# Patient Record
Sex: Female | Born: 1995 | Race: Black or African American | Hispanic: No | Marital: Single | Smoking: Former smoker
Health system: Southern US, Community
[De-identification: ages and names within clinical notes are randomized; demographics above are authoritative.]

## PROBLEM LIST (undated history)

## (undated) ENCOUNTER — Inpatient Hospital Stay (HOSPITAL_COMMUNITY): Payer: Self-pay

## (undated) DIAGNOSIS — F329 Major depressive disorder, single episode, unspecified: Secondary | ICD-10-CM

## (undated) DIAGNOSIS — F32A Depression, unspecified: Secondary | ICD-10-CM

## (undated) DIAGNOSIS — F419 Anxiety disorder, unspecified: Secondary | ICD-10-CM

## (undated) HISTORY — PX: NO PAST SURGERIES: SHX2092

---
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
## 2017-01-04 ENCOUNTER — Ambulatory Visit (INDEPENDENT_AMBULATORY_CARE_PROVIDER_SITE_OTHER): Payer: Medicaid Other | Admitting: Obstetrics & Gynecology

## 2017-01-04 DIAGNOSIS — R110 Nausea: Secondary | ICD-10-CM

## 2017-01-04 DIAGNOSIS — O219 Vomiting of pregnancy, unspecified: Secondary | ICD-10-CM

## 2017-01-04 DIAGNOSIS — Z3400 Encounter for supervision of normal first pregnancy, unspecified trimester: Secondary | ICD-10-CM

## 2017-01-04 DIAGNOSIS — K219 Gastro-esophageal reflux disease without esophagitis: Secondary | ICD-10-CM | POA: Insufficient documentation

## 2017-01-04 DIAGNOSIS — Z3403 Encounter for supervision of normal first pregnancy, third trimester: Secondary | ICD-10-CM

## 2017-01-04 MED ORDER — PANTOPRAZOLE SODIUM 40 MG PO TBEC: 40.0000 mg | DELAYED_RELEASE_TABLET | Freq: Every day | ORAL | 1 refills | Status: DC

## 2017-01-04 MED ORDER — METOCLOPRAMIDE HCL 5 MG PO TABS: 5.0000 mg | ORAL_TABLET | Freq: Three times a day (TID) | ORAL | 1 refills | Status: DC

## 2017-01-04 MED ORDER — ONDANSETRON 8 MG PO TBDP: ORAL_TABLET | ORAL | 0 refills | Status: DC

## 2017-01-04 NOTE — Progress Notes (Signed)
   PRENATAL VISIT NOTE  Subjective:nausea and reflux  Barbara Mack is a 21 y.o. G2P0010 at 1784w0d being seen today for ongoing prenatal care.  She is currently monitored for the following issues for this low-risk pregnancy and has Supervision of normal first pregnancy, antepartum; Gonorrhea affecting pregnancy, antepartum; Insufficient prenatal care; and History of hypertension in pediatric patient on her problem list.  Patient reports heartburn, nausea, vomiting and smoking marijuana to improve appetite.  Contractions: Irregular. Vag. Bleeding: None.  Movement: Present. Denies leaking of fluid.   The following portions of the patient's history were reviewed and updated as appropriate: allergies, current medications, past family history, past medical history, past social history, past surgical history and problem list. Problem list updated.  Objective:   Vitals:   01/04/17 1526  BP: 117/79  Pulse: 75  Weight: 144 lb (65.3 kg)    Fetal Status: Fetal Heart Rate (bpm): 143   Movement: Present     General:  Alert, oriented and cooperative. Patient is in no acute distress.  Skin: Skin is warm and dry. No rash noted.   Cardiovascular: Normal heart rate noted  Respiratory: Normal respiratory effort, no problems with respiration noted  Abdomen: Soft, gravid, appropriate for gestational age. Pain/Pressure: Present     Pelvic:  Cervical exam deferred        Extremities: Normal range of motion.  Edema: None  Mental Status: Normal mood and affect. Normal behavior. Normal judgment and thought content.   Assessment and Plan:  Pregnancy: G2P0010 at 184w0d  1. Supervision of normal first pregnancy, antepartum GERD  - pantoprazole (PROTONIX) 40 MG tablet; Take 1 tablet (40 mg total) by mouth daily.  Dispense: 30 tablet; Refill: 1 - metoCLOPramide (REGLAN) 5 MG tablet; Take 1 tablet (5 mg total) by mouth 4 (four) times daily -  before meals and at bedtime.  Dispense: 60 tablet; Refill: 1  2.  Nausea  - ondansetron (ZOFRAN-ODT) 8 MG disintegrating tablet; DISSOLVE ONE TABLET BY MOUTH EVERY 8 HOURS AS NEEDED FOR NAUSEA AND VOMITING  Dispense: 12 tablet; Refill: 0  Preterm labor symptoms and general obstetric precautions including but not limited to vaginal bleeding, contractions, leaking of fluid and fetal movement were reviewed in detail with the patient. Please refer to After Visit Summary for other counseling recommendations.  Return in about 2 weeks (around 01/18/2017).   Adam PhenixJames G Arnold, MD

## 2017-01-18 ENCOUNTER — Ambulatory Visit (INDEPENDENT_AMBULATORY_CARE_PROVIDER_SITE_OTHER): Payer: Medicaid Other | Admitting: Obstetrics & Gynecology

## 2017-01-18 VITALS — BP 121/71 | HR 97 | Temp 98.0°F | Wt 149.6 lb

## 2017-01-18 DIAGNOSIS — K219 Gastro-esophageal reflux disease without esophagitis: Secondary | ICD-10-CM

## 2017-01-18 DIAGNOSIS — Z3403 Encounter for supervision of normal first pregnancy, third trimester: Secondary | ICD-10-CM

## 2017-01-18 DIAGNOSIS — Z3400 Encounter for supervision of normal first pregnancy, unspecified trimester: Secondary | ICD-10-CM

## 2017-01-18 MED ORDER — PANTOPRAZOLE SODIUM 40 MG PO TBEC: 80.0000 mg | DELAYED_RELEASE_TABLET | Freq: Every day | ORAL | 1 refills | Status: DC

## 2017-01-18 NOTE — Patient Instructions (Signed)
Heartburn During Pregnancy Heartburn is a type of pain or discomfort that can happen in the throat or chest. It is often described as a burning sensation. Heartburn is common during pregnancy because:  A hormone (progesterone) that is released during pregnancy may relax the valve (lower esophageal sphincter, or LES) that separates the esophagus from the stomach. This allows stomach acid to move up into the esophagus, causing heartburn.  The uterus gets larger and pushes up on the stomach, which pushes more acid into the esophagus. This is especially true in the later stages of pregnancy. Heartburn usually goes away or gets better after giving birth. What are the causes? Heartburn is caused by stomach acid backing up into the esophagus (reflux). Reflux can be triggered by:  Changing hormone levels.  Large meals.  Certain foods and beverages, such as coffee, chocolate, onions, and peppermint.  Exercise.  Increased stomach acid production. What increases the risk? You are more likely to experience heartburn during pregnancy if you:  Had heartburn prior to becoming pregnant.  Have been pregnant more than once before.  Are overweight or obese. The likelihood that you will get heartburn also increases as you get farther along in your pregnancy, especially during the last trimester. What are the signs or symptoms? Symptoms of this condition include:  Burning pain in the chest or lower throat.  Bitter taste in the mouth.  Coughing.  Problems swallowing.  Vomiting.  Hoarse voice.  Asthma. Symptoms may get worse when you lie down or bend over. Symptoms are often worse at night. How is this diagnosed? This condition is diagnosed based on:  Your medical history.  Your symptoms.  Blood tests to check for a certain type of bacteria associated with heartburn.  Whether taking heartburn medicine relieves your symptoms.  Examination of the stomach and esophagus using a tube with  a light and camera on the end (endoscopy). How is this treated? Treatment varies depending on how severe your symptoms are. Your health care provider may recommend:  Over-the-counter medicines (antacids or acid reducers) for mild heartburn.  Prescription medicines to decrease stomach acid or to protect your stomach lining.  Certain changes in your diet.  Raising the head of your bed so it is higher than the foot of the bed. This helps prevent stomach acid from backing up into the esophagus when you are lying down. Follow these instructions at home: Eating and drinking  Do not drink alcohol during your pregnancy.  Identify foods and beverages that make your symptoms worse, and avoid them.  Beverages that you may want to avoid include:  Coffee and tea (with or without caffeine).  Energy drinks and sports drinks.  Carbonated drinks or sodas.  Citrus fruit juices.  Foods that you may want to avoid include:  Chocolate and cocoa.  Peppermint and mint flavorings.  Garlic, onions, and horseradish.  Spicy and acidic foods, including peppers, chili powder, curry powder, vinegar, hot sauces, and barbecue sauce.  Citrus fruits, such as oranges, lemons, and limes.  Tomato-based foods, such as red sauce, chili, and salsa.  Fried and fatty foods, such as donuts, french fries, potato chips, and high-fat dressings.  High-fat meats, such as hot dogs, cold cuts, sausage, ham, and bacon.  High-fat dairy items, such as whole milk, butter, and cheese.  Eat small, frequent meals instead of large meals.  Avoid drinking large amounts of liquid with your meals.  Avoid eating meals during the 2-3 hours before bedtime.  Avoid lying down right   after you eat.  Do not exercise right after you eat. Medicines  Take over-the-counter and prescription medicines only as told by your health care provider.  Do not take aspirin, ibuprofen, or other NSAIDs unless your health care provider tells  you to do that.  You may be instructed to avoid medicines that contain sodium bicarbonate. General instructions  If directed, raise the head of your bed about 6 inches (15 cm) by putting blocks under the legs. Sleeping with more pillows does not effectively relieve heartburn because it only changes the position of your head.  Do not use any products that contain nicotine or tobacco, such as cigarettes and e-cigarettes. If you need help quitting, ask your health care provider.  Wear loose-fitting clothing.  Try to reduce your stress, such as with yoga or meditation. If you need help managing stress, ask your health care provider.  Maintain a healthy weight. If you are overweight, work with your health care provider to safely lose weight.  Keep all follow-up visits as told by your health care provider. This is important. Contact a health care provider if:  You develop new symptoms.  Your symptoms do not improve with treatment.  You have unexplained weight loss.  You have difficulty swallowing.  You make loud sounds when you breathe (wheeze).  You have a cough that does not go away.  You have frequent heartburn for more than 2 weeks.  You have nausea or vomiting that does not get better with treatment.  You have pain in your abdomen. Get help right away if:  You have severe chest pain that spreads to your arm, neck, or jaw.  You feel sweaty, dizzy, or light-headed.  You have shortness of breath.  You have pain when swallowing.  You vomit, and your vomit looks like blood or coffee grounds.  Your stool is bloody or black. This information is not intended to replace advice given to you by your health care provider. Make sure you discuss any questions you have with your health care provider. Document Released: 11/25/2000 Document Revised: 08/15/2016 Document Reviewed: 08/15/2016 Elsevier Interactive Patient Education  2017 [employer] Inc[employer]  

## 2017-01-18 NOTE — Progress Notes (Signed)
   PRENATAL VISIT NOTE  Subjective:  Barbara Mack is a 21 y.o. G2P0010 at 4348w0d being seen today for ongoing prenatal care.  She is currently monitored for the following issues for this low-risk pregnancy and has Supervision of normal first pregnancy, antepartum; Gonorrhea affecting pregnancy, antepartum; Insufficient prenatal care; History of hypertension in pediatric patient; and GERD (gastroesophageal reflux disease) on her problem list.  Patient reports heartburn and nausea.  Contractions: Not present. Vag. Bleeding: None.  Movement: Present. Denies leaking of fluid.   The following portions of the patient's history were reviewed and updated as appropriate: allergies, current medications, past family history, past medical history, past social history, past surgical history and problem list. Problem list updated.  Objective:   Vitals:   01/18/17 1508  BP: 121/71  Pulse: 97  Temp: 98 F (36.7 C)  Weight: 149 lb 9.6 oz (67.9 kg)    Fetal Status: Fetal Heart Rate (bpm): 132 Fundal Height: 33 cm Movement: Present     General:  Alert, oriented and cooperative. Patient is in no acute distress.  Skin: Skin is warm and dry. No rash noted.   Cardiovascular: Normal heart rate noted  Respiratory: Normal respiratory effort, no problems with respiration noted  Abdomen: Soft, gravid, appropriate for gestational age. Pain/Pressure: Absent     Pelvic:  Cervical exam deferred        Extremities: Normal range of motion.  Edema: None  Mental Status: Normal mood and affect. Normal behavior. Normal judgment and thought content.   Assessment and Plan:  Pregnancy: G2P0010 at 6748w0d  1. Supervision of normal first pregnancy, antepartum Still has reflux taking Protonix and Reglan - pantoprazole (PROTONIX) 40 MG tablet; Take 2 tablets (80 mg total) by mouth daily.  Dispense: 60 tablet; Refill: 1  2. Gastroesophageal reflux disease, esophagitis presence not specified   Preterm labor symptoms and  general obstetric precautions including but not limited to vaginal bleeding, contractions, leaking of fluid and fetal movement were reviewed in detail with the patient. Please refer to After Visit Summary for other counseling recommendations.  Return in about 2 weeks (around 02/01/2017).   Adam PhenixJames G Arnold, MD

## 2017-01-18 NOTE — Progress Notes (Signed)
Patient reports that she had intense cramping a few nights ago without bleeding, but none since. Patient reports good fetal movement.

## 2017-02-02 ENCOUNTER — Other Ambulatory Visit (HOSPITAL_COMMUNITY)
Admission: RE | Admit: 2017-02-02 | Discharge: 2017-02-02 | Disposition: A | Discharge status: Home / Self Care | Payer: Medicaid Other | Source: Ambulatory Visit | Attending: Certified Nurse Midwife | Admitting: Certified Nurse Midwife

## 2017-02-02 ENCOUNTER — Ambulatory Visit (INDEPENDENT_AMBULATORY_CARE_PROVIDER_SITE_OTHER): Payer: Medicaid Other | Admitting: Certified Nurse Midwife

## 2017-02-02 VITALS — BP 106/67 | HR 82 | Wt 151.0 lb

## 2017-02-02 DIAGNOSIS — Z3A35 35 weeks gestation of pregnancy: Secondary | ICD-10-CM | POA: Insufficient documentation

## 2017-02-02 DIAGNOSIS — O0933 Supervision of pregnancy with insufficient antenatal care, third trimester: Secondary | ICD-10-CM

## 2017-02-02 DIAGNOSIS — Z3483 Encounter for supervision of other normal pregnancy, third trimester: Secondary | ICD-10-CM | POA: Insufficient documentation

## 2017-02-02 DIAGNOSIS — Z3400 Encounter for supervision of normal first pregnancy, unspecified trimester: Secondary | ICD-10-CM

## 2017-02-02 DIAGNOSIS — K219 Gastro-esophageal reflux disease without esophagitis: Secondary | ICD-10-CM

## 2017-02-02 NOTE — Progress Notes (Signed)
   PRENATAL VISIT NOTE  Subjective:  Barbara Mack is a 21 y.o. G2P0010 at 277w1d being seen today for ongoing prenatal care.  She is currently monitored for the following issues for this low-risk pregnancy and has Supervision of normal first pregnancy, antepartum; Gonorrhea affecting pregnancy, antepartum; Insufficient prenatal care; History of hypertension in pediatric patient; and GERD (gastroesophageal reflux disease) on her problem list.  Patient reports no complaints.  Contractions: Not present. Vag. Bleeding: None.  Movement: Present. Denies leaking of fluid.   The following portions of the patient's history were reviewed and updated as appropriate: allergies, current medications, past family history, past medical history, past social history, past surgical history and problem list. Problem list updated.  Objective:   Vitals:   02/02/17 1524  BP: 106/67  Pulse: 82  Weight: 151 lb (68.5 kg)    Fetal Status: Fetal Heart Rate (bpm): 140 Fundal Height: 35 cm Movement: Present  Presentation: Vertex  General:  Alert, oriented and cooperative. Patient is in no acute distress.  Skin: Skin is warm and dry. No rash noted.   Cardiovascular: Normal heart rate noted  Respiratory: Normal respiratory effort, no problems with respiration noted  Abdomen: Soft, gravid, appropriate for gestational age. Pain/Pressure: Present     Pelvic:  Cervical exam performed Dilation: Closed Effacement (%): 0 Station: Ballotable  Extremities: Normal range of motion.     Mental Status: Normal mood and affect. Normal behavior. Normal judgment and thought content.   Assessment and Plan:  Pregnancy: G2P0010 at 4677w1d  1. Gastroesophageal reflux disease, esophagitis presence not specified      Taking TUMS  2. Supervision of normal first pregnancy, antepartum      Doing well - Strep Gp B NAA - Cervicovaginal ancillary only  3. Insufficient prenatal care in third trimester       Preterm labor symptoms and  general obstetric precautions including but not limited to vaginal bleeding, contractions, leaking of fluid and fetal movement were reviewed in detail with the patient. Please refer to After Visit Summary for other counseling recommendations.  Return in about 1 week (around 02/09/2017) for ROB.   Roe Coombsachelle A Keerthana Vanrossum, CNM

## 2017-02-02 NOTE — Progress Notes (Signed)
Pt states lower pelvic pressure/pain.

## 2017-02-03 LAB — OB RESULTS CONSOLE GC/CHLAMYDIA: Gonorrhea: NEGATIVE

## 2017-02-03 LAB — CERVICOVAGINAL ANCILLARY ONLY
BACTERIAL VAGINITIS: NEGATIVE
CANDIDA VAGINITIS: POSITIVE — AB
Chlamydia: NEGATIVE
Neisseria Gonorrhea: NEGATIVE
Trichomonas: NEGATIVE

## 2017-02-04 LAB — STREP GP B NAA: STREP GROUP B AG: POSITIVE — AB

## 2017-02-06 ENCOUNTER — Other Ambulatory Visit: Payer: Self-pay | Admitting: Certified Nurse Midwife

## 2017-02-06 DIAGNOSIS — B3731 Acute candidiasis of vulva and vagina: Secondary | ICD-10-CM

## 2017-02-06 DIAGNOSIS — B373 Candidiasis of vulva and vagina: Secondary | ICD-10-CM

## 2017-02-06 DIAGNOSIS — Z3400 Encounter for supervision of normal first pregnancy, unspecified trimester: Secondary | ICD-10-CM

## 2017-02-06 MED ORDER — FLUCONAZOLE 150 MG PO TABS: 150.0000 mg | ORAL_TABLET | Freq: Once | ORAL | 0 refills | Status: AC

## 2017-02-09 ENCOUNTER — Encounter: Payer: Medicaid Other | Admitting: Certified Nurse Midwife

## 2017-02-13 ENCOUNTER — Ambulatory Visit (INDEPENDENT_AMBULATORY_CARE_PROVIDER_SITE_OTHER): Payer: Medicaid Other | Admitting: Certified Nurse Midwife

## 2017-02-13 VITALS — BP 108/67 | HR 97 | Wt 154.0 lb

## 2017-02-13 DIAGNOSIS — K219 Gastro-esophageal reflux disease without esophagitis: Secondary | ICD-10-CM

## 2017-02-13 DIAGNOSIS — O99613 Diseases of the digestive system complicating pregnancy, third trimester: Secondary | ICD-10-CM

## 2017-02-13 DIAGNOSIS — Z3483 Encounter for supervision of other normal pregnancy, third trimester: Secondary | ICD-10-CM

## 2017-02-13 DIAGNOSIS — Z3400 Encounter for supervision of normal first pregnancy, unspecified trimester: Secondary | ICD-10-CM

## 2017-02-13 NOTE — Progress Notes (Signed)
Pt states increase in cramping.

## 2017-02-13 NOTE — Progress Notes (Signed)
   PRENATAL VISIT NOTE  Subjective:  Barbara Mack is a 21 y.o. G2P0010 at 4574w5d being seen today for ongoing prenatal care.  She is currently monitored for the following issues for this low-risk pregnancy and has Supervision of normal first pregnancy, antepartum; Gonorrhea affecting pregnancy, antepartum; Insufficient prenatal care; and GERD (gastroesophageal reflux disease) on her problem list.  Patient reports no bleeding, no leaking and occasional contractions.  Contractions: Irregular. Vag. Bleeding: None.  Movement: Present. Denies leaking of fluid.   The following portions of the patient's history were reviewed and updated as appropriate: allergies, current medications, past family history, past medical history, past social history, past surgical history and problem list. Problem list updated.  Objective:   Vitals:   02/13/17 1558  BP: 108/67  Pulse: 97  Weight: 154 lb (69.9 kg)    Fetal Status: Fetal Heart Rate (bpm): 138 Fundal Height: 36 cm Movement: Present  Presentation: Vertex  General:  Alert, oriented and cooperative. Patient is in no acute distress.  Skin: Skin is warm and dry. No rash noted.   Cardiovascular: Normal heart rate noted  Respiratory: Normal respiratory effort, no problems with respiration noted  Abdomen: Soft, gravid, appropriate for gestational age. Pain/Pressure: Present     Pelvic:  Cervical exam performed Dilation: 1 Effacement (%): 50 Station: -3  Extremities: Normal range of motion.     Mental Status: Normal mood and affect. Normal behavior. Normal judgment and thought content.   Assessment and Plan:  Pregnancy: G2P0010 at 2274w5d  1. Gastroesophageal reflux disease, esophagitis presence not specified     Taking Protonix, Reglan and OTC Tums.   2. Supervision of normal first pregnancy, antepartum      Doing well.  Labor s/s reviewed.   Preterm labor symptoms and general obstetric precautions including but not limited to vaginal bleeding,  contractions, leaking of fluid and fetal movement were reviewed in detail with the patient. Please refer to After Visit Summary for other counseling recommendations.  Return in about 1 week (around 02/20/2017) for ROB.   Roe Coombsachelle A Avrey Flanagin, CNM

## 2017-02-15 ENCOUNTER — Inpatient Hospital Stay (EMERGENCY_DEPARTMENT_HOSPITAL)
Admission: AD | Admit: 2017-02-15 | Discharge: 2017-02-15 | Disposition: A | Discharge status: Home / Self Care | Payer: Medicaid Other | Source: Ambulatory Visit | Attending: Obstetrics & Gynecology | Admitting: Obstetrics & Gynecology

## 2017-02-15 ENCOUNTER — Encounter (HOSPITAL_COMMUNITY): Payer: Self-pay | Admitting: *Deleted

## 2017-02-15 ENCOUNTER — Inpatient Hospital Stay (HOSPITAL_COMMUNITY): Payer: Medicaid Other

## 2017-02-15 DIAGNOSIS — Z3A37 37 weeks gestation of pregnancy: Secondary | ICD-10-CM

## 2017-02-15 DIAGNOSIS — O26893 Other specified pregnancy related conditions, third trimester: Secondary | ICD-10-CM | POA: Insufficient documentation

## 2017-02-15 DIAGNOSIS — O479 False labor, unspecified: Secondary | ICD-10-CM

## 2017-02-15 DIAGNOSIS — O288 Other abnormal findings on antenatal screening of mother: Secondary | ICD-10-CM

## 2017-02-15 DIAGNOSIS — O9989 Other specified diseases and conditions complicating pregnancy, childbirth and the puerperium: Secondary | ICD-10-CM | POA: Diagnosis not present

## 2017-02-15 DIAGNOSIS — R030 Elevated blood-pressure reading, without diagnosis of hypertension: Secondary | ICD-10-CM

## 2017-02-15 LAB — CBC
HEMATOCRIT: 31.8 % — AB (ref 36.0–46.0)
Hemoglobin: 10.6 g/dL — ABNORMAL LOW (ref 12.0–15.0)
MCH: 29.1 pg (ref 26.0–34.0)
MCHC: 33.3 g/dL (ref 30.0–36.0)
MCV: 87.4 fL (ref 78.0–100.0)
Platelets: 205 10*3/uL (ref 150–400)
RBC: 3.64 MIL/uL — AB (ref 3.87–5.11)
RDW: 13.7 % (ref 11.5–15.5)
WBC: 9.9 10*3/uL (ref 4.0–10.5)

## 2017-02-15 LAB — COMPREHENSIVE METABOLIC PANEL
ALT: 17 U/L (ref 14–54)
AST: 20 U/L (ref 15–41)
Albumin: 3.1 g/dL — ABNORMAL LOW (ref 3.5–5.0)
Alkaline Phosphatase: 115 U/L (ref 38–126)
Anion gap: 8 (ref 5–15)
BILIRUBIN TOTAL: 0.3 mg/dL (ref 0.3–1.2)
CO2: 21 mmol/L — ABNORMAL LOW (ref 22–32)
CREATININE: 0.55 mg/dL (ref 0.44–1.00)
Calcium: 8.4 mg/dL — ABNORMAL LOW (ref 8.9–10.3)
Chloride: 105 mmol/L (ref 101–111)
GFR calc Af Amer: >60 mL/min (ref >60)
Glucose, Bld: 78 mg/dL (ref 65–99)
Potassium: 3.3 mmol/L — ABNORMAL LOW (ref 3.5–5.1)
Sodium: 134 mmol/L — ABNORMAL LOW (ref 135–145)
TOTAL PROTEIN: 6.8 g/dL (ref 6.5–8.1)

## 2017-02-15 LAB — PROTEIN / CREATININE RATIO, URINE
CREATININE, URINE: 179 mg/dL
Protein Creatinine Ratio: 0.2 mg/mg{Cre} — ABNORMAL HIGH (ref 0.00–0.15)
Total Protein, Urine: 36 mg/dL

## 2017-02-15 MED ORDER — FENTANYL CITRATE (PF) 100 MCG/2ML IJ SOLN
50.0000 ug | Freq: Once | INTRAMUSCULAR | Status: AC
  Administered 2017-02-15: 50 ug via INTRAMUSCULAR
  Filled 2017-02-15 (×2): qty 2

## 2017-02-15 NOTE — Discharge Instructions (Signed)
Hypertension During Pregnancy Hypertension is also called high blood pressure. High blood pressure means that the force of your blood moving in your body is too strong. When you are pregnant, this condition should be watched carefully. It can cause problems for you and your baby. Follow these instructions at home: Eating and drinking  Drink enough fluid to keep your pee (urine) clear or pale yellow.  Eat healthy foods that are low in salt (sodium). ? Do not add salt to your food. ? Check labels on foods and drinks to see much salt is in them. Look on the label where you see "Sodium." Lifestyle  Do not use any products that contain nicotine or tobacco, such as cigarettes and e-cigarettes. If you need help quitting, ask your doctor.  Do not use alcohol.  Avoid caffeine.  Avoid stress. Rest and get plenty of sleep. General instructions  Take over-the-counter and prescription medicines only as told by your doctor.  While lying down, lie on your left side. This keeps pressure off your baby.  While sitting or lying down, raise (elevate) your feet. Try putting some pillows under your lower legs.  Exercise regularly. Ask your doctor what kinds of exercise are best for you.  Keep all prenatal and follow-up visits as told by your doctor. This is important. Contact a doctor if:  You have symptoms that your doctor told you to watch for, such as: ? Fever. ? Throwing up (vomiting). ? Headache. Get help right away if:  You have very bad pain in your belly (abdomen).  You are throwing up, and this does not get better with treatment.  You suddenly get swelling in your hands, ankles, or face.  You gain 4 lb (1.8 kg) or more in 1 week.  You get bleeding from your vagina.  You have blood in your pee.  You do not feel your baby moving as much as normal.  You have a change in vision.  You have muscle twitching or sudden tightening (spasms).  You have trouble breathing.  Your lips  or fingernails turn blue. This information is not intended to replace advice given to you by your health care provider. Make sure you discuss any questions you have with your health care provider. Document Released: 12/31/2010 Document Revised: 08/09/2016 Document Reviewed: 08/09/2016 Elsevier Interactive Patient Education  2017 Elsevier Inc.  

## 2017-02-15 NOTE — MAU Provider Note (Signed)
History     CSN: [id number]  Arrival date and time: 02/15/17 1301   None     Chief Complaint  Patient presents with  . Contractions   HPI   Ms.Barbara Mack is a 21 y.o. female G2P0010 @ 80w0dhere in MAU with complaints of contractions.  Patient reports good fetal  Movement. She denies vaginal bleeding or leaking of fluid. Patient declines history of hypertension. Says she never had HTN, says she never took medication for HTN. Confirmed this with her grandmother who says "just a strong family history of hypertension" .   OB History    Gravida Para Term Preterm AB Living   2       1     SAB TAB Ectopic Multiple Live Births   1              Past Medical History:  Diagnosis Date  . Anxiety   . Depression    currenly not on meds, doing better    Past Surgical History:  Procedure Laterality Date  . NO PAST SURGERIES      Family History  Problem Relation Age of Onset  . Diabetes Mother   . Hypertension Mother   . Fibromyalgia Mother   . Diabetes Maternal Grandmother   . Hypertension Maternal Grandmother   . Stroke Maternal Grandmother   . Kidney disease Paternal Grandmother     Social History  Substance Use Topics  . Smoking status: Former Smoker    Packs/day: 0.25    Years: 6.00    Types: Cigarettes  . Smokeless tobacco: Never Used  . Alcohol use No    Allergies:  Allergies  Allergen Reactions  . Lactose Intolerance (Gi)     Prescriptions Prior to Admission  Medication Sig Dispense Refill Last Dose  . calcium carbonate (TUMS - DOSED IN MG ELEMENTAL CALCIUM) 500 MG chewable tablet Chew 1 tablet by mouth daily.   Taking  . metoCLOPramide (REGLAN) 5 MG tablet Take 1 tablet (5 mg total) by mouth 4 (four) times daily -  before meals and at bedtime. 60 tablet 1 Taking  . ondansetron (ZOFRAN-ODT) 8 MG disintegrating tablet DISSOLVE ONE TABLET BY MOUTH EVERY 8 HOURS AS NEEDED FOR NAUSEA AND VOMITING 12 tablet 0 Taking  . pantoprazole (PROTONIX) 40 MG tablet  Take 2 tablets (80 mg total) by mouth daily. 60 tablet 1   . Prenatal Vit-Fe Fumarate-FA (MULTIVITAMIN-PRENATAL) 27-0.8 MG TABS tablet Take 1 tablet by mouth daily at 12 noon.   Taking   Results for orders placed or performed during the hospital encounter of 02/15/17 (from the past 48 hour(s))  CBC     Status: Abnormal   Collection Time: 02/15/17  3:08 PM  Result Value Ref Range   WBC 9.9 4.0 - 10.5 K/uL   RBC 3.64 (L) 3.87 - 5.11 MIL/uL   Hemoglobin 10.6 (L) 12.0 - 15.0 g/dL   HCT 31.8 (L) 36.0 - 46.0 %   MCV 87.4 78.0 - 100.0 fL   MCH 29.1 26.0 - 34.0 pg   MCHC 33.3 30.0 - 36.0 g/dL   RDW 13.7 11.5 - 15.5 %   Platelets 205 150 - 400 K/uL  Comprehensive metabolic panel     Status: Abnormal   Collection Time: 02/15/17  3:08 PM  Result Value Ref Range   Sodium 134 (L) 135 - 145 mmol/L   Potassium 3.3 (L) 3.5 - 5.1 mmol/L   Chloride 105 101 - 111 mmol/L   CO2 21 (  L) 22 - 32 mmol/L   Glucose, Bld 78 65 - 99 mg/dL   BUN <5 (L) 6 - 20 mg/dL    Comment: REPEATED TO VERIFY   Creatinine, Ser 0.55 0.44 - 1.00 mg/dL   Calcium 8.4 (L) 8.9 - 10.3 mg/dL   Total Protein 6.8 6.5 - 8.1 g/dL   Albumin 3.1 (L) 3.5 - 5.0 g/dL   AST 20 15 - 41 U/L   ALT 17 14 - 54 U/L   Alkaline Phosphatase 115 38 - 126 U/L   Total Bilirubin 0.3 0.3 - 1.2 mg/dL   GFR calc non Af Amer >60 >60 mL/min   GFR calc Af Amer >60 >60 mL/min    Comment: (NOTE) The eGFR has been calculated using the CKD EPI equation. This calculation has not been validated in all clinical situations. eGFR's persistently <60 mL/min signify possible Chronic Kidney Disease.    Anion gap 8 5 - 15  Protein / creatinine ratio, urine     Status: Abnormal   Collection Time: 02/15/17  4:20 PM  Result Value Ref Range   Creatinine, Urine 179.00 mg/dL   Total Protein, Urine 36 mg/dL    Comment: NO NORMAL RANGE ESTABLISHED FOR THIS TEST   Protein Creatinine Ratio 0.20 (H) 0.00 - 0.15 mg/mg[Cre]    Review of Systems  Constitutional: Negative  for fever.  Eyes: Negative for photophobia.  Gastrointestinal: Positive for abdominal pain (Contraction pain, no upper abdominal pain. ).  Neurological: Negative for headaches.   Physical Exam   Blood pressure 135/92, pulse 86, temperature 97.7 F (36.5 C), temperature source Oral, resp. rate 18, last menstrual period 06/01/2016, SpO2 100 %.   Patient Vitals for the past 24 hrs:  BP Temp Temp src Pulse Resp SpO2  02/15/17 1922 135/92 - - 86 - -  02/15/17 1800 123/66 - - 65 - -  02/15/17 1757 120/73 - - 77 18 -  02/15/17 1715 122/60 - - 78 - -  02/15/17 1700 120/60 - - 70 - -  02/15/17 1646 119/71 - - 82 - -  02/15/17 1615 138/93 - - 84 - -  02/15/17 1600 126/83 - - 81 - -  02/15/17 1530 132/85 - - 89 - -  02/15/17 1515 142/93 - - 81 - -  02/15/17 1500 139/87 - - 73 - -  02/15/17 1453 133/84 - - 85 - -  02/15/17 1451 128/90 - - 85 - -  02/15/17 1313 136/78 97.7 F (36.5 C) Oral 100 17 100 %    Physical Exam  Constitutional: She is oriented to person, place, and time. She appears well-developed and well-nourished. She appears distressed (Patient uncomfortable with contractions ).  HENT:  Head: Normocephalic.  Respiratory: Effort normal.  GI: Soft. She exhibits no distension. There is no tenderness. There is no rebound.  Genitourinary:  Genitourinary Comments: On arrival patient was 2 cm/ 90%  Dilation: 3 Effacement (%): 90 Cervical Position: Anterior Station: -2 Presentation: Vertex Exam by:: Weston,RN  Musculoskeletal: Normal range of motion.  Neurological: She is alert and oriented to person, place, and time.  Skin: Skin is warm.  Psychiatric: Her behavior is normal.   Fetal Tracing: Baseline: 135 bpm  Variability: Moderate  Accelerations: 10x10 and 15x15 Decelerations: frequent variables  Toco: Irregular pattern Q3-5  MAU Course  Procedures  None  MDM   Reviewed fetal tracing: BPP ordered BPP 8/8> reactive NST Discussed previous BP readings from  record with Dr. Ihor Dow.  BP elevated 128/90, 142/93,  [facility] labs ordered Discussed patient with Dr. Ihor Dow Fentanyl given 50 mcg IM, will evaluate and recheck her cervix.  Cervix unchanged from previous exam.  BP down  1451: first elevated BP 128/90; Discharge BP @ 1922 135/92 Discussed with Dr. Ihor Dow, ok to DC home.   Patient has had no cervical change in 2 hours. Patient's pain is down to 4/10 following fentanyl.   Assessment and Plan   A:   1. Elevated BP without diagnosis of hypertension   2. Non-reactive NST (non-stress test)   3. Irregular uterine contractions     P:  Discharge home in stable condition Strict return precautions.  Preeclampsia precautions  Go to the [facility] @ 0800 for BP check Return to MAU if symptoms worsen  Kick counts   Lezlie Lye, NP 02/15/2017 8:16 PM

## 2017-02-15 NOTE — MAU Note (Signed)
Saw Dr on Monday, was checked 1/50, started contracting and bleeding after exam. Yesterday the contractions continued and she lost her mucous plug. Contractions are getting worser; last 1 min with 4-985min breaks between them.

## 2017-02-15 NOTE — MAU Note (Signed)
Pt reports contractions since Monday.

## 2017-02-16 ENCOUNTER — Inpatient Hospital Stay (HOSPITAL_COMMUNITY): Payer: Medicaid Other | Admitting: Anesthesiology

## 2017-02-16 ENCOUNTER — Encounter (HOSPITAL_COMMUNITY): Payer: Self-pay

## 2017-02-16 ENCOUNTER — Inpatient Hospital Stay (HOSPITAL_COMMUNITY)
Admission: AD | Admit: 2017-02-16 | Discharge: 2017-02-18 | DRG: 775 | Disposition: A | Discharge status: Home / Self Care | Payer: Medicaid Other | Source: Ambulatory Visit | Attending: Obstetrics & Gynecology | Admitting: Obstetrics & Gynecology

## 2017-02-16 DIAGNOSIS — Z8249 Family history of ischemic heart disease and other diseases of the circulatory system: Secondary | ICD-10-CM

## 2017-02-16 DIAGNOSIS — O99824 Streptococcus B carrier state complicating childbirth: Secondary | ICD-10-CM | POA: Diagnosis present

## 2017-02-16 DIAGNOSIS — Z3A37 37 weeks gestation of pregnancy: Secondary | ICD-10-CM

## 2017-02-16 DIAGNOSIS — Z823 Family history of stroke: Secondary | ICD-10-CM | POA: Diagnosis not present

## 2017-02-16 DIAGNOSIS — Z833 Family history of diabetes mellitus: Secondary | ICD-10-CM

## 2017-02-16 DIAGNOSIS — Z87891 Personal history of nicotine dependence: Secondary | ICD-10-CM

## 2017-02-16 DIAGNOSIS — Z3493 Encounter for supervision of normal pregnancy, unspecified, third trimester: Secondary | ICD-10-CM | POA: Diagnosis present

## 2017-02-16 LAB — TYPE AND SCREEN
ABO/RH(D): A POS
Antibody Screen: NEGATIVE

## 2017-02-16 LAB — CBC
HCT: 33.4 % — ABNORMAL LOW (ref 36.0–46.0)
Hemoglobin: 11.3 g/dL — ABNORMAL LOW (ref 12.0–15.0)
MCH: 29.1 pg (ref 26.0–34.0)
MCHC: 33.8 g/dL (ref 30.0–36.0)
MCV: 86.1 fL (ref 78.0–100.0)
PLATELETS: 226 10*3/uL (ref 150–400)
RBC: 3.88 MIL/uL (ref 3.87–5.11)
RDW: 13.7 % (ref 11.5–15.5)
WBC: 11.2 10*3/uL — ABNORMAL HIGH (ref 4.0–10.5)

## 2017-02-16 LAB — ABO/RH: ABO/RH(D): A POS

## 2017-02-16 LAB — POCT FERN TEST: POCT Fern Test: POSITIVE

## 2017-02-16 MED ORDER — FLEET ENEMA 7-19 GM/118ML RE ENEM: 1.0000 | ENEMA | RECTAL | Status: DC | PRN

## 2017-02-16 MED ORDER — OXYCODONE HCL 5 MG PO TABS
5.0000 mg | ORAL_TABLET | ORAL | Status: DC | PRN
  Administered 2017-02-16: 5 mg via ORAL
  Filled 2017-02-16: qty 1

## 2017-02-16 MED ORDER — DIBUCAINE 1 % RE OINT: 1.0000 "application " | TOPICAL_OINTMENT | RECTAL | Status: DC | PRN

## 2017-02-16 MED ORDER — ONDANSETRON HCL 4 MG/2ML IJ SOLN
4.0000 mg | Freq: Four times a day (QID) | INTRAMUSCULAR | Status: DC | PRN
  Administered 2017-02-16: 4 mg via INTRAVENOUS
  Filled 2017-02-16: qty 2

## 2017-02-16 MED ORDER — OXYTOCIN 40 UNITS IN LACTATED RINGERS INFUSION - SIMPLE MED
2.5000 [IU]/h | INTRAVENOUS | Status: DC
  Filled 2017-02-16: qty 1000

## 2017-02-16 MED ORDER — ACETAMINOPHEN 325 MG PO TABS: 650.0000 mg | ORAL_TABLET | ORAL | Status: DC | PRN

## 2017-02-16 MED ORDER — PENICILLIN G POT IN DEXTROSE 60000 UNIT/ML IV SOLN
3.0000 10*6.[IU] | INTRAVENOUS | Status: DC
  Filled 2017-02-16 (×3): qty 50

## 2017-02-16 MED ORDER — EPHEDRINE 5 MG/ML INJ
10.0000 mg | INTRAVENOUS | Status: DC | PRN
Start: 2017-02-16 — End: 2017-02-16
  Filled 2017-02-16: qty 4

## 2017-02-16 MED ORDER — ZOLPIDEM TARTRATE 5 MG PO TABS: 5.0000 mg | ORAL_TABLET | Freq: Every evening | ORAL | Status: DC | PRN

## 2017-02-16 MED ORDER — FENTANYL CITRATE (PF) 100 MCG/2ML IJ SOLN: 50.0000 ug | INTRAMUSCULAR | Status: DC | PRN

## 2017-02-16 MED ORDER — EPHEDRINE 5 MG/ML INJ
10.0000 mg | INTRAVENOUS | Status: DC | PRN
  Filled 2017-02-16: qty 4

## 2017-02-16 MED ORDER — SIMETHICONE 80 MG PO CHEW: 80.0000 mg | CHEWABLE_TABLET | ORAL | Status: DC | PRN

## 2017-02-16 MED ORDER — OXYCODONE-ACETAMINOPHEN 5-325 MG PO TABS: 1.0000 | ORAL_TABLET | ORAL | Status: DC | PRN

## 2017-02-16 MED ORDER — LACTATED RINGERS IV SOLN
INTRAVENOUS | Status: DC
  Administered 2017-02-16: 03:00:00 via INTRAVENOUS

## 2017-02-16 MED ORDER — TETANUS-DIPHTH-ACELL PERTUSSIS 5-2.5-18.5 LF-MCG/0.5 IM SUSP: 0.5000 mL | Freq: Once | INTRAMUSCULAR | Status: DC

## 2017-02-16 MED ORDER — LACTATED RINGERS IV SOLN
500.0000 mL | INTRAVENOUS | Status: DC | PRN
  Administered 2017-02-16: 250 mL via INTRAVENOUS

## 2017-02-16 MED ORDER — SOD CITRATE-CITRIC ACID 500-334 MG/5ML PO SOLN
30.0000 mL | ORAL | Status: DC | PRN
  Filled 2017-02-16: qty 15

## 2017-02-16 MED ORDER — LIDOCAINE HCL (PF) 1 % IJ SOLN
INTRAMUSCULAR | Status: DC | PRN
  Administered 2017-02-16 (×2): 5 mL

## 2017-02-16 MED ORDER — FENTANYL 2.5 MCG/ML BUPIVACAINE 1/10 % EPIDURAL INFUSION (WH - ANES)
14.0000 mL/h | INTRAMUSCULAR | Status: DC | PRN
  Administered 2017-02-16: 14 mL/h via EPIDURAL
  Filled 2017-02-16 (×3): qty 100

## 2017-02-16 MED ORDER — OXYCODONE-ACETAMINOPHEN 5-325 MG PO TABS: 2.0000 | ORAL_TABLET | ORAL | Status: DC | PRN

## 2017-02-16 MED ORDER — TERBUTALINE SULFATE 1 MG/ML IJ SOLN
0.2500 mg | Freq: Once | INTRAMUSCULAR | Status: DC | PRN
  Filled 2017-02-16: qty 1

## 2017-02-16 MED ORDER — DIPHENHYDRAMINE HCL 50 MG/ML IJ SOLN: 12.5000 mg | INTRAMUSCULAR | Status: DC | PRN

## 2017-02-16 MED ORDER — LACTATED RINGERS IV SOLN: 500.0000 mL | Freq: Once | INTRAVENOUS | Status: DC

## 2017-02-16 MED ORDER — BENZOCAINE-MENTHOL 20-0.5 % EX AERO
1.0000 "application " | INHALATION_SPRAY | CUTANEOUS | Status: DC | PRN
  Administered 2017-02-16: 1 via TOPICAL
  Filled 2017-02-16: qty 56

## 2017-02-16 MED ORDER — DIPHENHYDRAMINE HCL 25 MG PO CAPS: 25.0000 mg | ORAL_CAPSULE | Freq: Four times a day (QID) | ORAL | Status: DC | PRN

## 2017-02-16 MED ORDER — OXYTOCIN BOLUS FROM INFUSION
500.0000 mL | Freq: Once | INTRAVENOUS | Status: AC
  Administered 2017-02-16: 500 mL via INTRAVENOUS

## 2017-02-16 MED ORDER — COCONUT OIL OIL: 1.0000 "application " | TOPICAL_OIL | Status: DC | PRN

## 2017-02-16 MED ORDER — PHENYLEPHRINE 40 MCG/ML (10ML) SYRINGE FOR IV PUSH (FOR BLOOD PRESSURE SUPPORT)
80.0000 ug | PREFILLED_SYRINGE | INTRAVENOUS | Status: DC | PRN
  Filled 2017-02-16: qty 5

## 2017-02-16 MED ORDER — PHENYLEPHRINE 40 MCG/ML (10ML) SYRINGE FOR IV PUSH (FOR BLOOD PRESSURE SUPPORT)
80.0000 ug | PREFILLED_SYRINGE | INTRAVENOUS | Status: DC | PRN
  Filled 2017-02-16: qty 5
  Filled 2017-02-16 (×2): qty 10

## 2017-02-16 MED ORDER — SENNOSIDES-DOCUSATE SODIUM 8.6-50 MG PO TABS
2.0000 | ORAL_TABLET | ORAL | Status: DC
  Administered 2017-02-16 – 2017-02-17 (×2): 2 via ORAL
  Filled 2017-02-16 (×2): qty 2

## 2017-02-16 MED ORDER — WITCH HAZEL-GLYCERIN EX PADS: 1.0000 "application " | MEDICATED_PAD | CUTANEOUS | Status: DC | PRN

## 2017-02-16 MED ORDER — IBUPROFEN 600 MG PO TABS
600.0000 mg | ORAL_TABLET | Freq: Four times a day (QID) | ORAL | Status: DC
  Administered 2017-02-16 – 2017-02-18 (×9): 600 mg via ORAL
  Filled 2017-02-16 (×9): qty 1

## 2017-02-16 MED ORDER — ONDANSETRON HCL 4 MG PO TABS: 4.0000 mg | ORAL_TABLET | ORAL | Status: DC | PRN

## 2017-02-16 MED ORDER — PENICILLIN G POTASSIUM 5000000 UNITS IJ SOLR
5.0000 10*6.[IU] | Freq: Once | INTRAVENOUS | Status: AC
  Administered 2017-02-16: 5 10*6.[IU] via INTRAVENOUS
  Filled 2017-02-16: qty 5

## 2017-02-16 MED ORDER — ACETAMINOPHEN 325 MG PO TABS
650.0000 mg | ORAL_TABLET | ORAL | Status: DC | PRN
  Administered 2017-02-17: 650 mg via ORAL
  Filled 2017-02-16: qty 2

## 2017-02-16 MED ORDER — LIDOCAINE HCL (PF) 1 % IJ SOLN
30.0000 mL | INTRAMUSCULAR | Status: DC | PRN
  Administered 2017-02-16: 30 mL via SUBCUTANEOUS
  Filled 2017-02-16: qty 30

## 2017-02-16 MED ORDER — ONDANSETRON HCL 4 MG/2ML IJ SOLN: 4.0000 mg | INTRAMUSCULAR | Status: DC | PRN

## 2017-02-16 MED ORDER — PRENATAL MULTIVITAMIN CH
1.0000 | ORAL_TABLET | Freq: Every day | ORAL | Status: DC
  Administered 2017-02-16 – 2017-02-18 (×3): 1 via ORAL
  Filled 2017-02-16 (×3): qty 1

## 2017-02-16 MED ORDER — FENTANYL CITRATE (PF) 100 MCG/2ML IJ SOLN
100.0000 ug | INTRAMUSCULAR | Status: DC | PRN
  Administered 2017-02-16: 100 ug via INTRAVENOUS
  Filled 2017-02-16: qty 2

## 2017-02-16 NOTE — H&P (Signed)
Barbara Mack is a 21 y.o. female G1 @ 37.1wks presenting for reg ctx today. Having some sm bloody show; denies leaking. Her preg has been followed by the [facility] office and has been remarkable for 1) onset of care @ 22.5wks 2) GBS pos 3) +GC in 10/17 (neg TOC)  OB History    Gravida Para Term Preterm AB Living   2       1     SAB TAB Ectopic Multiple Live Births   1             Past Medical History:  Diagnosis Date  . Anxiety   . Depression    currenly not on meds, doing better   Past Surgical History:  Procedure Laterality Date  . NO PAST SURGERIES     Family History: family history includes Diabetes in her maternal grandmother and mother; Fibromyalgia in her mother; Hypertension in her maternal grandmother and mother; Kidney disease in her paternal grandmother; Stroke in her maternal grandmother. Social History:  reports that she has quit smoking. Her smoking use included Cigarettes. She has a 1.50 pack-year smoking history. She has never used smokeless tobacco. She reports that she uses drugs, including Marijuana, about 7 times per week. She reports that she does not drink alcohol.     Maternal Diabetes: No Genetic Screening: Normal Maternal Ultrasounds/Referrals: Normal Fetal Ultrasounds or other Referrals:  None Maternal Substance Abuse:  No Significant Maternal Medications:  None Significant Maternal Lab Results:  Lab values include: Group B Strep positive Other Comments:  None  ROS History Dilation: 3 Effacement (%): 100 Exam by:: Kayren EavesAshley Garvey RN  Blood pressure 131/66, pulse 61, temperature 97.8 F (36.6 C), temperature source Oral, resp. rate 20, height 5\' 4"  (1.626 m), weight 68 kg (150 lb), last menstrual period 06/01/2016. Exam Physical Exam  Constitutional: She is oriented to person, place, and time. She appears well-developed.  HENT:  Head: Normocephalic.  Neck: Normal range of motion.  Cardiovascular: Normal rate.   Respiratory: Effort normal.  GI:   EFM 130s, +accels, occ mi variables Ctx irreg, q 3-8 mins  Musculoskeletal: Normal range of motion.  Neurological: She is alert and oriented to person, place, and time.  Skin: Skin is warm and dry.  Psychiatric: She has a normal mood and affect. Her behavior is normal. Thought content normal.    Prenatal labs: ABO, Rh: A/Positive/-- (11/27 1435) Antibody: Negative (11/27 1435) Rubella: 2.44 (11/27 1435) RPR: Non Reactive (12/11 1130)  HBsAg: Negative (11/27 1435)  HIV: Non Reactive (12/11 1130)  GBS: Positive (02/22 1628)   Assessment/Plan: IUP@37 .1wks Latent labor GBS pos  [facility] for GBS ppx Expectant management Plan Fentanyl for pain initially, and then if labor is progressing will get epidural Anticipate SVD   SHAW, KIMBERLY CNM 02/16/2017, 3:14 AM

## 2017-02-16 NOTE — Anesthesia Preprocedure Evaluation (Signed)

## 2017-02-16 NOTE — Anesthesia Procedure Notes (Signed)
Epidural Patient location during procedure: OB  Staffing Anesthesiologist: Fontaine Kossman Performed: anesthesiologist   Preanesthetic Checklist Completed: patient identified, site marked, surgical consent, pre-op evaluation, timeout performed, IV checked, risks and benefits discussed and monitors and equipment checked  Epidural Patient position: sitting Prep: DuraPrep Patient monitoring: heart rate, continuous pulse ox and blood pressure Approach: right paramedian Location: L3-L4 Injection technique: LOR saline  Needle:  Needle type: Tuohy  Needle gauge: 17 G Needle length: 9 cm and 9 Needle insertion depth: 4 cm Catheter type: closed end flexible Catheter size: 20 Guage Catheter at skin depth: 8 cm Test dose: negative  Assessment Events: blood not aspirated, injection not painful, no injection resistance, negative IV test and no paresthesia  Additional Notes Patient identified. Risks/Benefits/Options discussed with patient including but not limited to bleeding, infection, nerve damage, paralysis, failed block, incomplete pain control, headache, blood pressure changes, nausea, vomiting, reactions to medication both or allergic, itching and postpartum back pain. Confirmed with bedside nurse the patient's most recent platelet count. Confirmed with patient that they are not currently taking any anticoagulation, have any bleeding history or any family history of bleeding disorders. Patient expressed understanding and wished to proceed. All questions were answered. Sterile technique was used throughout the entire procedure. Please see nursing notes for vital signs. Test dose was given through epidural needle and negative prior to continuing to dose epidural or start infusion. Warning signs of high block given to the patient including shortness of breath, tingling/numbness in hands, complete motor block, or any concerning symptoms with instructions to call for help. Patient was given  instructions on fall risk and not to get out of bed. All questions and concerns addressed with instructions to call with any issues.     

## 2017-02-16 NOTE — Anesthesia Postprocedure Evaluation (Signed)
Anesthesia Post Note  Patient: Barbara Mack  Procedure(s) Performed: * No procedures listed *  Patient location during evaluation: Mother Baby Anesthesia Type: Epidural Level of consciousness: awake Pain management: pain level controlled Vital Signs Assessment: post-procedure vital signs reviewed and stable Respiratory status: spontaneous breathing Cardiovascular status: stable Postop Assessment: no headache, no backache, epidural receding and patient able to bend at knees Anesthetic complications: no        Last Vitals:  Vitals:   02/16/17 1000 02/16/17 1006  BP: 126/65 (P) 126/65  Pulse: 68 (!) (P) 59  Resp: 18 (P) 18  Temp: 36.6 C     Last Pain:  Vitals:   02/16/17 1000  TempSrc: Oral  PainSc: 0-No pain   Pain Goal:                 Edison PaceWILKERSON,Delorse Shane

## 2017-02-16 NOTE — MAU Note (Signed)
Patient presents with ctx 2-3 mins apart.

## 2017-02-17 ENCOUNTER — Ambulatory Visit: Payer: Medicaid Other

## 2017-02-17 LAB — RPR: RPR Ser Ql: NONREACTIVE

## 2017-02-17 MED ORDER — MEDROXYPROGESTERONE ACETATE 150 MG/ML IM SUSP
150.0000 mg | Freq: Once | INTRAMUSCULAR | Status: AC
  Administered 2017-02-18: 150 mg via INTRAMUSCULAR
  Filled 2017-02-17: qty 1

## 2017-02-17 NOTE — Progress Notes (Signed)
Post Partum Day#1 Subjective: no complaints, up ad lib, voiding and tolerating PO  Objective: Blood pressure 135/89, pulse 64, temperature 98.2 F (36.8 C), temperature source Oral, resp. rate 18, height 5\' 4"  (1.626 m), weight 68 kg (150 lb), last menstrual period 06/01/2016, SpO2 97 %, unknown if currently breastfeeding.  Physical Exam:  General: alert Lochia: appropriate Uterine Fundus: firm and NT at U-1 DVT Evaluation: No evidence of DVT seen on physical exam.   Recent Labs  02/15/17 1508 02/16/17 0223  HGB 10.6* 11.3*  HCT 31.8* 33.4*    Assessment/Plan: Plan for discharge tomorrow  Depo provera today, plans IUD in 6 weeks Rec call [facility] today for 6 week pp visit   LOS: 1 day   Allie BossierMyra C Trinidee Schrag 02/17/2017, 7:35 AM

## 2017-02-17 NOTE — Clinical Social Work Maternal (Signed)
CLINICAL SOCIAL WORK MATERNAL/CHILD NOTE  Patient Details  Name: Barbara Mack MRN: [id number] Date of Birth: 06/18/1996  Date:  07/14/2017  Clinical Social Worker Initiating Note:  Terri Piedra, [place] Date/ Time Initiated:  02/17/17/1500     Child's Name:  Barbara Mack   Legal Guardian:  Other (Comment) (Parents: Bevely Palmer and Edd Arbour)   Need for Interpreter:  None   Date of Referral:  04/22/17     Reason for Referral:  Current Substance Use/Substance Use During Pregnancy , Other (Comment) (THC use and hx anx/dep)   Referral Source:  [employer]   Address:  [address] [place], [place] [address]  Phone number:  [phone]   Household Members:  Significant Other, Relatives (MOB states she lives with her aunt, aunt's husband and FOB.)   Natural Supports (not living in the home):   (MOB stated, "I support myself.")   Professional Supports: None (MOB is not interested in "programs" or support groups.)   Employment: Ship broker, Other (comment)   Type of Work: MOB has two exams to take before obtaining her GED.  She states she has a job waiting at [employer] on [employer]" once she has her GED.  She reports that FOB receives a check for "aging out of foster care."   Education:      Financial Resources:  Medicaid   Other Resources:  [employer], Food Stamps    Cultural/Religious Considerations Which May Impact Care: None stated.  MOB's facesheet notes religion as Non-Denominational.  Strengths:  Ability to meet basic needs , Home prepared for child    Risk Factors/Current Problems:  Mental Health Concerns , Substance Use    Cognitive State:  Able to Concentrate , Alert , Goal Oriented , Insightful , Linear Thinking    Mood/Affect:  Calm , Comfortable , Euthymic , Interested    CSW Assessment: CSW met with MOB in her first floor room/126 to offer support and complete assessment due to hx of marijuana use and hx of anx/dep.  MOB was in bed holding baby.  Bonding is  evident.  She was welcoming and receptive to CSW's visit.  When CSW arrived, MOB stated concerns that baby is jittery and asked CSW if it is normal.  CSW noted jitteriness and called RN to come evaluate.  MOB then said, "I think I should have breast fed him.  Is it too late to start?"  CSW informed her that it is not too late and offered to get lactation to come meet with her.  She was appreciative.  CSW asked why she had decided against breast feeding initially.  MOB states she has heard that breast feeding makes you lose weight and she feels she is small enough already and does not want to lose weight.  She states, however, that she does not think baby likes the formula.  CSW informed MOB of the benefits of breast feeding and encouraged her to try it if she has any interest.   MOB reports she is happy about baby and has everything she needs for him at home.  She reports understanding of SIDS precautions as reviewed by CSW.  She reports that she and FOB are in a relationship and that he is involved and supportive.  She reports that they have been in a relationship for almost 2 years.  She reports that they live with her aunt and that this is stable housing.  She reports having good supports including her siblings and friends, but that she "supports  myself."  MOB informed CSW that her father is in prison and her mother was recently released from prison and is currently living in a halfway house in [place].  She states she grew up in group homes because of her parents' absence.  She reports that she had counseling when she was younger, but that it was forced.  She discloses a hx of anxiety and depression, but feels she is doing well currently.  MOB was attentive to education regarding PMADs provided by CSW.  She is not interested in community resources for mental health support, but was willing to take information if she identifies concerns in the future.   CSW inquired about marijuana use.  MOB admits to smoking  marijuana early in pregnancy to help with nausea and vomiting.  She reports, "I couldn't eat" and states she got off of marijuana by transitioning to taking zofran.  She estimates her last use was in October.  MOB was understanding of hospital drug screen policy as explained by CSW.  CSW will monitor baby's drug screen results and make report to CPS as indicated.    CSW Plan/Description:  Information/Referral to Community Resources , No Further Intervention Required/No Barriers to Discharge, Patient/Family Education     Addisson Frate Elizabeth, LCSW 02/17/2017, 4:00 PM 

## 2017-02-17 NOTE — Lactation Note (Signed)
This note was copied from a baby's chart. Lactation Consultation Note  Patient Name: Barbara Mack ZOXWR'UToday's Date: 02/17/2017 Reason for consult: Initial assessment   With this 21 year old, first time mom, who came in formula feeding, but has decided to try breast feeding. Mom had just fed the baby formula, and was lying in bed with the baby next to her. I assited mom with placing the baby skin to skin, and cautioned her that if she got sleepy, to put the baby back in his crib. Mom will call for her nurse, to call lactation, for help with latching her baby, when he shows hunger cues. I also tole mom we would set up a DEP for her at that time. Mom is active with [facility].   Maternal Data    Feeding Feeding Type: Formula Nipple Type: Slow - flow  LATCH Score/Interventions                      Lactation Tools Discussed/Used WIC Program: Yes   Consult Status Consult Status: Follow-up Date: 02/17/17 Follow-up type: In-patient    Alfred LevinsLee, Champ Keetch Anne 02/17/2017, 5:10 PM

## 2017-02-18 MED ORDER — IBUPROFEN 600 MG PO TABS: 600.0000 mg | ORAL_TABLET | Freq: Four times a day (QID) | ORAL | 0 refills | Status: DC

## 2017-02-18 MED ORDER — ACETAMINOPHEN 325 MG PO TABS: 650.0000 mg | ORAL_TABLET | ORAL | 0 refills | Status: DC | PRN

## 2017-02-18 MED ORDER — SENNOSIDES-DOCUSATE SODIUM 8.6-50 MG PO TABS: 2.0000 | ORAL_TABLET | ORAL | 0 refills | Status: DC

## 2017-02-18 NOTE — Progress Notes (Signed)
The social work consult from 02/17/2017 4:00 pm states:"She reports that she and FOB are in a relationship and that he is involved and supportive.  She reports that they have been in a relationship for almost 2 years.  She reports that they live with her aunt and that this is stable housing." The pt's aunt came in this morning with the car seat to take pt and baby home. When the FOB came in the room, the patient introduced her boyfriend to her aunt for the 1st time, evident by the fact that the aunt stated "when are you going to introduce us, where have you been?" The pt stated, he just walked in, he was here all yesterday.Marland Kitchen..Marland Kitchen

## 2017-02-18 NOTE — Progress Notes (Signed)
Baby's UDS (+) for THC. [facility] Rexene EdisonKacey Owens. No barriers to d/c.   Teila Skalsky, MSW, LCSW-A Clinical Social Worker  [facility]  Office: [phone]

## 2017-02-18 NOTE — Discharge Summary (Signed)
OB Discharge Summary     Patient Name: Barbara Mack DOB: 12/27/1995 MRN: [id number]  Date of admission: 02/16/2017 Delivering MD: Cam HaiSHAW, Jeanita Carneiro D   Date of discharge: 02/18/2017  Admitting diagnosis: 37 WEEKS CTX Intrauterine pregnancy: 55105w1d     Secondary diagnosis:  Active Problems:   Normal labor  Additional problems: none     Discharge diagnosis: Term Pregnancy Delivered                                                                                                Post partum procedures:none  Augmentation: none  Complications: None  Hospital course:  Onset of Labor With Vaginal Delivery     21 y.o. yo G2P1011 at 83105w1d was admitted in Active Labor on 02/16/2017. Patient had an uncomplicated labor course as follows:  Membrane Rupture Time/Date: 11:00 PM ,02/15/2017   Intrapartum Procedures: Episiotomy: None [1]                                         Lacerations:  1st degree [2]  Patient had a delivery of a Viable infant. 02/16/2017  Information for the patient's newborn:  Barbara Mack, Barbara Mack [[id number]][[id number]]  Delivery Method: Vaginal, Spontaneous Delivery (Filed from Delivery Summary)    Pateint had an uncomplicated postpartum course.  She is ambulating, tolerating a regular diet, passing flatus, and urinating well. Patient is discharged home in stable condition on 02/18/17.   Physical exam  Vitals:   02/16/17 2126 02/17/17 0500 02/17/17 1834 02/18/17 0526  BP: 118/75 135/89 (!) 117/57 119/67  Pulse: 63 64 68 63  Resp: 16 18 18 17   Temp: 97.8 F (36.6 C) 98.2 F (36.8 C) 98.2 F (36.8 C) 97.8 F (36.6 C)  TempSrc: Oral Oral Oral Oral  SpO2:      Weight:      Height:       General: alert Lochia: appropriate Uterine Fundus: firm Incision: N/A DVT Evaluation: No evidence of DVT seen on physical exam. Labs: Lab Results  Component Value Date   WBC 11.2 (H) 02/16/2017   HGB 11.3 (L) 02/16/2017   HCT 33.4 (L) 02/16/2017   MCV 86.1 02/16/2017   PLT 226 02/16/2017    CMP Latest Ref Rng & Units 02/15/2017  Glucose 65 - 99 mg/dL 78  BUN 6 - 20 mg/dL <7(W<5(L)  Creatinine 2.950.44 - 1.00 mg/dL 6.210.55  Sodium 308135 - 657145 mmol/L 134(L)  Potassium 3.5 - 5.1 mmol/L 3.3(L)  Chloride 101 - 111 mmol/L 105  CO2 22 - 32 mmol/L 21(L)  Calcium 8.9 - 10.3 mg/dL 8.4(O8.4(L)  Total Protein 6.5 - 8.1 g/dL 6.8  Total Bilirubin 0.3 - 1.2 mg/dL 0.3  Alkaline Phos 38 - 126 U/L 115  AST 15 - 41 U/L 20  ALT 14 - 54 U/L 17    Discharge instruction: per After Visit Summary and "Baby and Me Booklet".  After visit meds:  Allergies as of 02/18/2017      Reactions   Lactose Intolerance (gi)  Nausea And Vomiting      Medication List    STOP taking these medications   calcium carbonate 500 MG chewable tablet Commonly known as:  TUMS - dosed in mg elemental calcium     TAKE these medications   acetaminophen 325 MG tablet Commonly known as:  TYLENOL Take 2 tablets (650 mg total) by mouth every 4 (four) hours as needed (for pain scale < 4).   ibuprofen 600 MG tablet Commonly known as:  ADVIL,MOTRIN Take 1 tablet (600 mg total) by mouth every 6 (six) hours.   prenatal multivitamin Tabs tablet Take 1 tablet by mouth daily at 12 noon.   senna-docusate 8.6-50 MG tablet Commonly known as:  Senokot-S Take 2 tablets by mouth daily. Start taking on:  02/19/2017      Follow-up Information    [facility] Follow up.   Specialty:  Obstetrics and Gynecology Why:  follow up 4-6 weeks Contact information: [address], [address] [place] [address] [phone]       [facility] ADMISS[facility] Follow up.   Why:  as needed for emergencies Contact information: [address] [id number] [place] [address] [phone]           Diet: routine diet  Activity: Advance as tolerated. Pelvic rest for 6 weeks.   Outpatient follow up:6 weeks Follow up Appt:No future  appointments. Follow up Visit:No Follow-up on file.  Postpartum contraception: IUD .  Newborn Data: Live born female  Birth Weight: 5 lb 10.3 oz (2560 g) APGAR: 8, 9  Baby Feeding: Bottle Disposition:home with mother   02/18/2017 Barbara Settle do Silvio Clayman, MD   CNM attestation I have seen and examined this patient and agree with above documentation in the resident's note.   Barbara Mack is a 21 y.o. G2P1011 s/p SVD.   Pain is well controlled.  Plan for birth control is IUD.  Method of Feeding: bottle  PE:  BP 119/67   Pulse 63   Temp 97.8 F (36.6 C) (Oral)   Resp 17   Ht 5\' 4"  (1.626 m)   Wt 68 kg (150 lb)   LMP 06/01/2016   SpO2 97%   Breastfeeding? Unknown   BMI 25.75 kg/m  Fundus firm   Recent Labs  02/15/17 1508 02/16/17 0223  HGB 10.6* 11.3*  HCT 31.8* 33.4*     Plan: discharge today - postpartum care discussed - f/u clinic in 4-6 weeks for postpartum visit   KAMEAH, RAWL, CNM 9:33 AM 02/18/2017

## 2017-02-18 NOTE — Discharge Instructions (Signed)

## 2017-02-21 ENCOUNTER — Encounter: Payer: Medicaid Other | Admitting: Certified Nurse Midwife

## 2017-07-12 IMAGING — US US PELVIS COMPLETE
1 series · 14 of 25 positions shown · non-contrast
Comparison: None

CLINICAL DATA: Patient with abnormal uterine bleeding. Menorrhagia.



[Series 1: us pelvis complete · 14 of 56 slices shown]
[im 1/56]
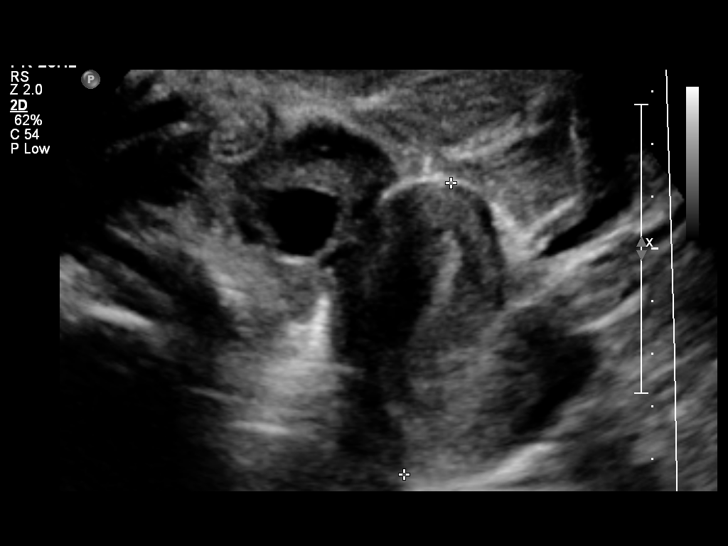
[im 5/56]
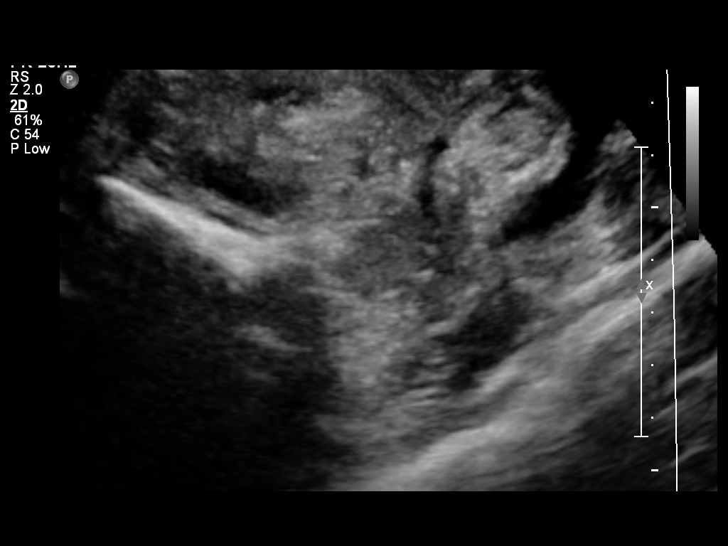
[im 10/56]
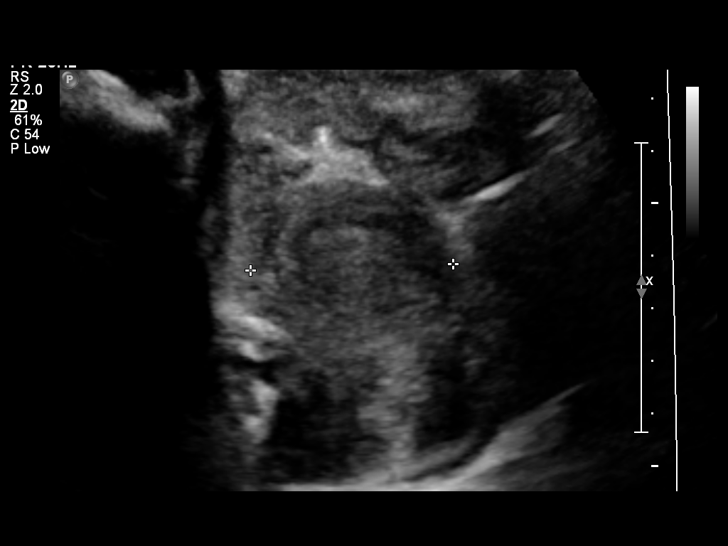
[im 14/56]
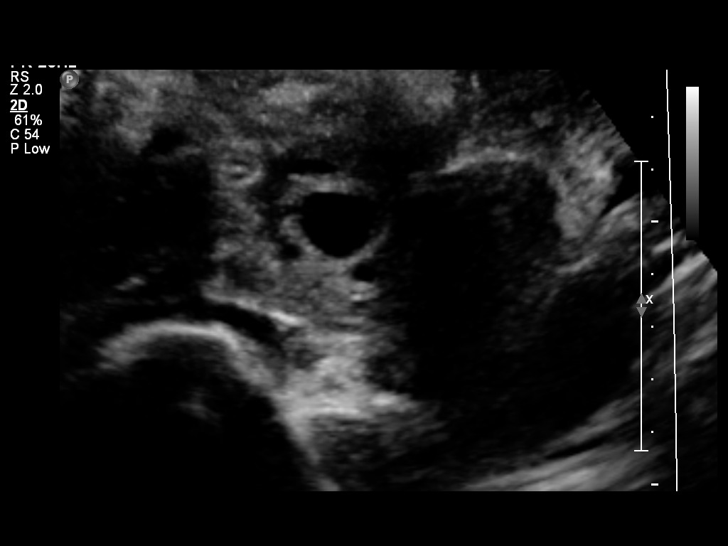
[im 19/56]
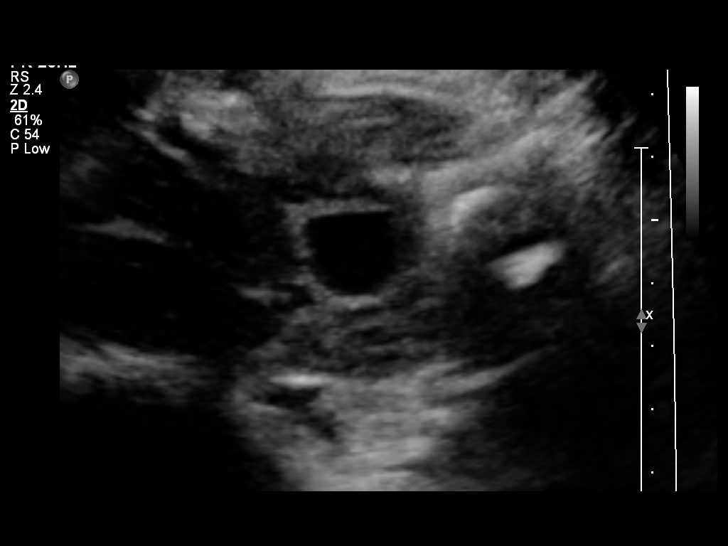
[im 21/56]
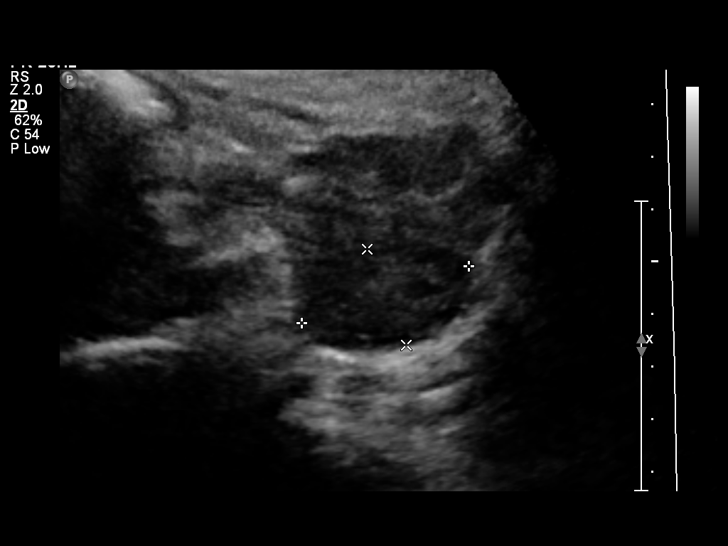
[im 26/56]
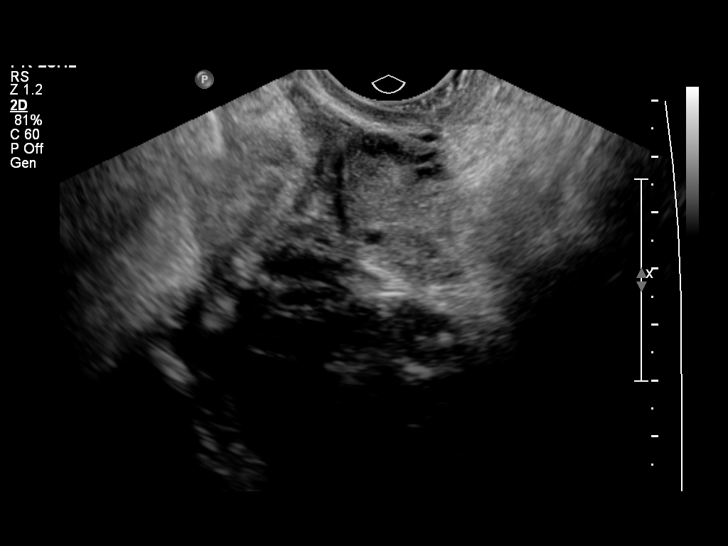
[im 30/56]
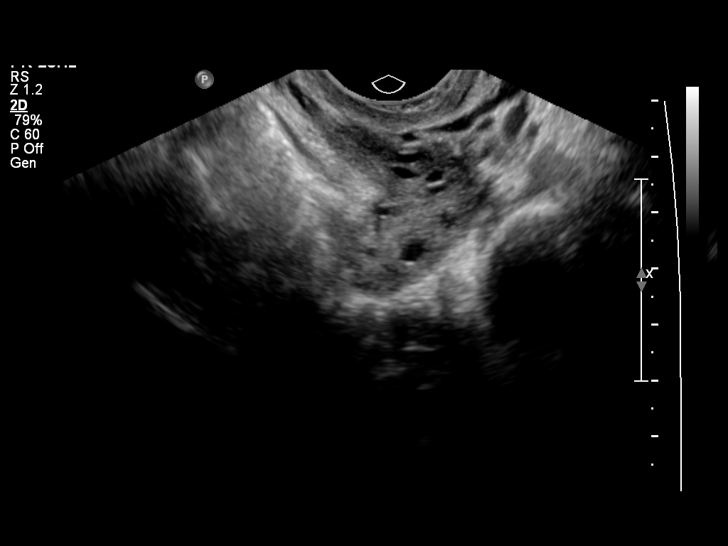
[im 35/56]
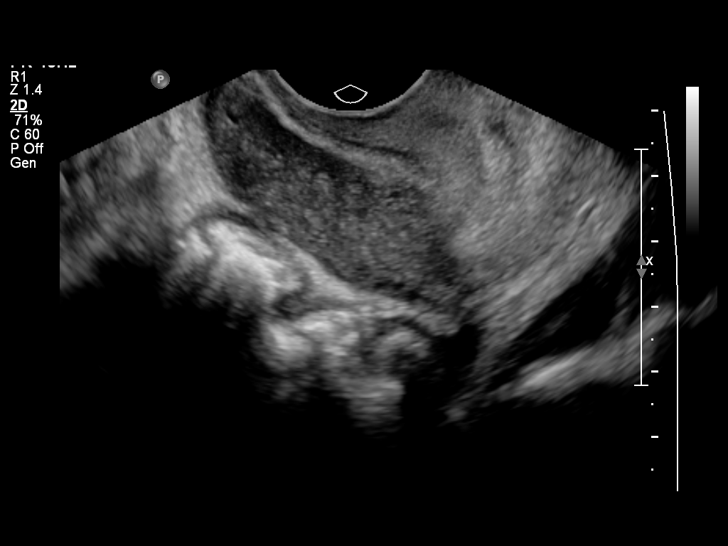
[im 37/56]
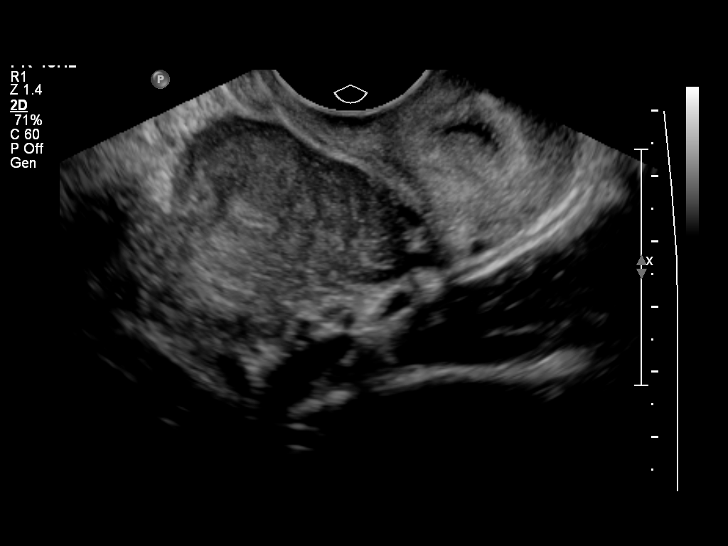
[im 42/56]
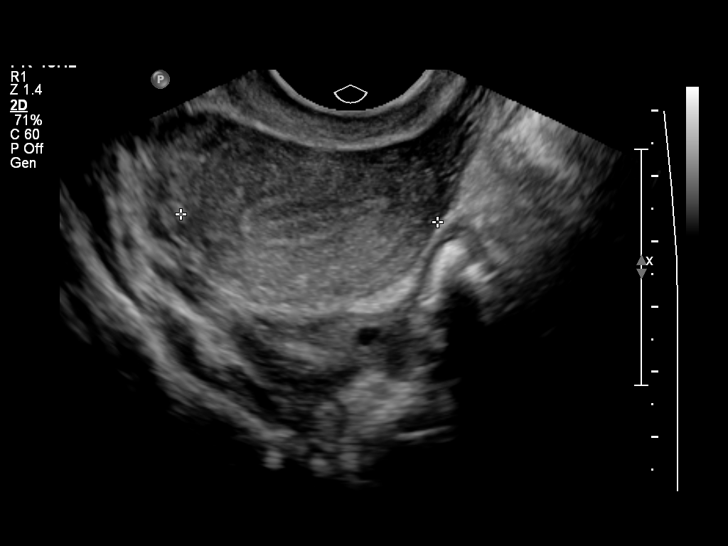
[im 46/56]
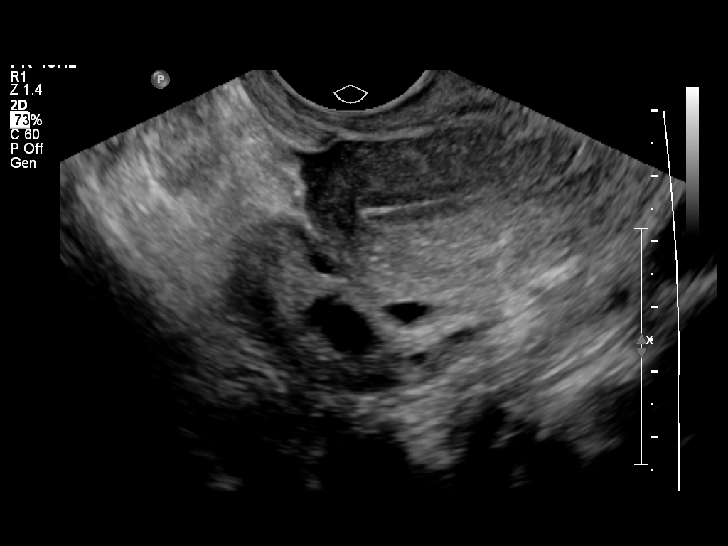
[im 51/56]
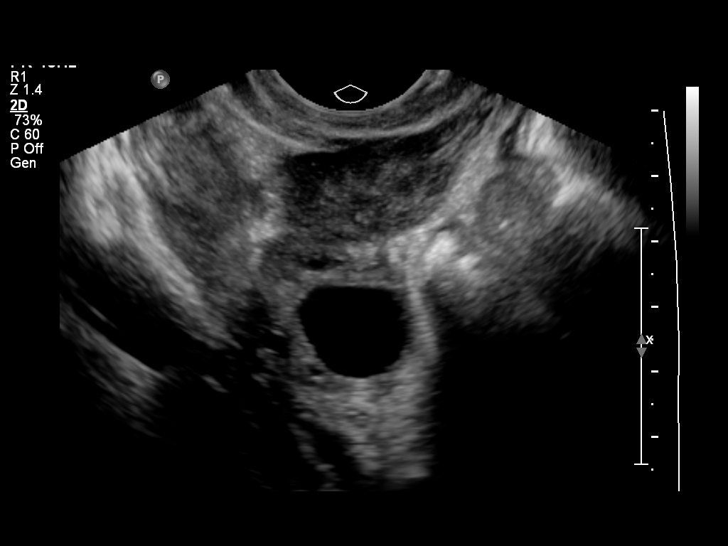
[im 56/56]
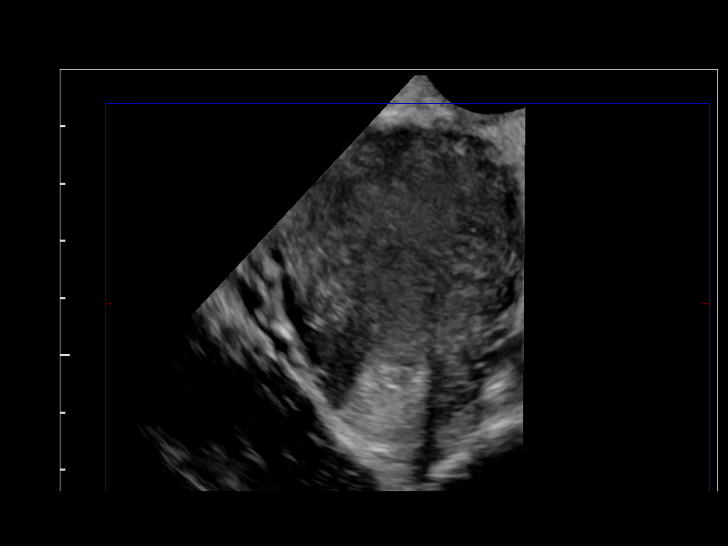

[14 of 25 positions shown; findings below may reference images not displayed]

FINDINGS: Uterus

Measurements: 6.3 x 2.5 x 3.9 cm. No fibroids or other mass
visualized.

Endometrium

Thickness: 5 mm.  No focal abnormality visualized.

Right ovary

Measurements: 3.6 x 2.0 x 2.1 cm. Normal appearance/no adnexal mass.

Left ovary

Measurements: 3.1 x 2.3 x 1.9 cm. Normal appearance/no adnexal mass.

Other findings

No free fluid.
IMPRESSION: Endometrium measures 5 mm. If bleeding remains unresponsive to
hormonal or medical therapy, sonohysterogram should be considered
for focal lesion work-up. (Ref: Radiological Reasoning: Algorithmic
Workup of Abnormal Vaginal Bleeding with Endovaginal Sonography and
Sonohysterography. AJR [id number]; 191:S68-73)

## 2017-09-15 ENCOUNTER — Emergency Department (HOSPITAL_COMMUNITY)
Admission: EM | Admit: 2017-09-15 | Discharge: 2017-09-15 | Disposition: A | Discharge status: Home / Self Care | Payer: Medicaid Other | Attending: Emergency Medicine | Admitting: Emergency Medicine

## 2017-09-15 ENCOUNTER — Encounter (HOSPITAL_COMMUNITY): Payer: Self-pay

## 2017-09-15 DIAGNOSIS — Z77098 Contact with and (suspected) exposure to other hazardous, chiefly nonmedicinal, chemicals: Secondary | ICD-10-CM | POA: Insufficient documentation

## 2017-09-15 DIAGNOSIS — H5789 Other specified disorders of eye and adnexa: Secondary | ICD-10-CM | POA: Diagnosis present

## 2017-09-15 NOTE — ED Triage Notes (Signed)
Per Pt, Pt is coming from work with complaints of getting Clorox in her right eye. Pt was at work and she was cleaning out the refrigerator when the Clorox splashed up into her eye. Flushed in the sink. Pt has had eye flushed here and stated it is helping. No change in vision.

## 2017-09-15 NOTE — ED Notes (Signed)
Pt cannot be found no answer nurse firsat made aware

## 2018-01-07 ENCOUNTER — Emergency Department (HOSPITAL_COMMUNITY)
Admission: EM | Admit: 2018-01-07 | Discharge: 2018-01-08 | Disposition: A | Discharge status: Home / Self Care | Payer: Medicaid Other | Attending: Emergency Medicine | Admitting: Emergency Medicine

## 2018-01-07 ENCOUNTER — Encounter (HOSPITAL_COMMUNITY): Payer: Self-pay | Admitting: Emergency Medicine

## 2018-01-07 ENCOUNTER — Emergency Department (HOSPITAL_COMMUNITY): Payer: Medicaid Other

## 2018-01-07 DIAGNOSIS — F918 Other conduct disorders: Secondary | ICD-10-CM | POA: Insufficient documentation

## 2018-01-07 DIAGNOSIS — Z79899 Other long term (current) drug therapy: Secondary | ICD-10-CM | POA: Diagnosis not present

## 2018-01-07 DIAGNOSIS — F10920 Alcohol use, unspecified with intoxication, uncomplicated: Secondary | ICD-10-CM | POA: Diagnosis not present

## 2018-01-07 DIAGNOSIS — Z87891 Personal history of nicotine dependence: Secondary | ICD-10-CM | POA: Diagnosis not present

## 2018-01-07 DIAGNOSIS — F329 Major depressive disorder, single episode, unspecified: Secondary | ICD-10-CM | POA: Diagnosis not present

## 2018-01-07 DIAGNOSIS — R45851 Suicidal ideations: Secondary | ICD-10-CM | POA: Diagnosis not present

## 2018-01-07 DIAGNOSIS — Z046 Encounter for general psychiatric examination, requested by authority: Secondary | ICD-10-CM | POA: Insufficient documentation

## 2018-01-07 DIAGNOSIS — R400 Somnolence: Secondary | ICD-10-CM | POA: Insufficient documentation

## 2018-01-07 DIAGNOSIS — F1910 Other psychoactive substance abuse, uncomplicated: Secondary | ICD-10-CM | POA: Diagnosis not present

## 2018-01-07 DIAGNOSIS — Z202 Contact with and (suspected) exposure to infections with a predominantly sexual mode of transmission: Secondary | ICD-10-CM | POA: Insufficient documentation

## 2018-01-07 DIAGNOSIS — R451 Restlessness and agitation: Secondary | ICD-10-CM | POA: Diagnosis not present

## 2018-01-07 DIAGNOSIS — F1290 Cannabis use, unspecified, uncomplicated: Secondary | ICD-10-CM | POA: Diagnosis not present

## 2018-01-07 LAB — RAPID URINE DRUG SCREEN, HOSP PERFORMED
AMPHETAMINES: NOT DETECTED
Barbiturates: NOT DETECTED
Benzodiazepines: NOT DETECTED
Cocaine: POSITIVE — AB
OPIATES: NOT DETECTED
Tetrahydrocannabinol: POSITIVE — AB

## 2018-01-07 LAB — COMPREHENSIVE METABOLIC PANEL
ALT: 18 U/L (ref 14–54)
AST: 24 U/L (ref 15–41)
Albumin: 5 g/dL (ref 3.5–5.0)
Alkaline Phosphatase: 58 U/L (ref 38–126)
Anion gap: 11 (ref 5–15)
BUN: 10 mg/dL (ref 6–20)
CHLORIDE: 106 mmol/L (ref 101–111)
CO2: 21 mmol/L — ABNORMAL LOW (ref 22–32)
CREATININE: 0.86 mg/dL (ref 0.44–1.00)
Calcium: 9.3 mg/dL (ref 8.9–10.3)
GFR calc Af Amer: >60 mL/min (ref >60)
Glucose, Bld: 74 mg/dL (ref 65–99)
Potassium: 3.5 mmol/L (ref 3.5–5.1)
Sodium: 138 mmol/L (ref 135–145)
Total Bilirubin: 0.9 mg/dL (ref 0.3–1.2)
Total Protein: 8.5 g/dL — ABNORMAL HIGH (ref 6.5–8.1)

## 2018-01-07 LAB — ACETAMINOPHEN LEVEL: Acetaminophen (Tylenol), Serum: <10 ug/mL — ABNORMAL LOW (ref 10–30)

## 2018-01-07 LAB — CBC
HCT: 40.6 % (ref 36.0–46.0)
Hemoglobin: 13.3 g/dL (ref 12.0–15.0)
MCH: 28.2 pg (ref 26.0–34.0)
MCHC: 32.8 g/dL (ref 30.0–36.0)
MCV: 86 fL (ref 78.0–100.0)
PLATELETS: 266 10*3/uL (ref 150–400)
RBC: 4.72 MIL/uL (ref 3.87–5.11)
RDW: 14.7 % (ref 11.5–15.5)
WBC: 8.7 10*3/uL (ref 4.0–10.5)

## 2018-01-07 LAB — ETHANOL: ALCOHOL ETHYL (B): 187 mg/dL — AB (ref <10)

## 2018-01-07 LAB — I-STAT BETA HCG BLOOD, ED (MC, WL, AP ONLY): I-stat hCG, quantitative: <5 m[IU]/mL (ref <5)

## 2018-01-07 LAB — CBG MONITORING, ED: GLUCOSE-CAPILLARY: 74 mg/dL (ref 65–99)

## 2018-01-07 LAB — SALICYLATE LEVEL

## 2018-01-07 MED ORDER — CEFIXIME 400 MG PO CAPS
400.0000 mg | ORAL_CAPSULE | Freq: Once | ORAL | Status: AC
  Administered 2018-01-08: 400 mg via ORAL
  Filled 2018-01-07: qty 1

## 2018-01-07 MED ORDER — SENNOSIDES-DOCUSATE SODIUM 8.6-50 MG PO TABS: 2.0000 | ORAL_TABLET | ORAL | Status: DC

## 2018-01-07 MED ORDER — METRONIDAZOLE 500 MG PO TABS: 2000.0000 mg | ORAL_TABLET | Freq: Once | ORAL | Status: DC

## 2018-01-07 MED ORDER — IBUPROFEN 200 MG PO TABS
600.0000 mg | ORAL_TABLET | Freq: Four times a day (QID) | ORAL | Status: DC
  Administered 2018-01-08: 600 mg via ORAL
  Filled 2018-01-07: qty 3

## 2018-01-07 MED ORDER — ACETAMINOPHEN 325 MG PO TABS: 650.0000 mg | ORAL_TABLET | ORAL | Status: DC | PRN

## 2018-01-07 MED ORDER — ONDANSETRON HCL 4 MG/2ML IJ SOLN
4.0000 mg | Freq: Once | INTRAMUSCULAR | Status: AC
  Administered 2018-01-07: 4 mg via INTRAVENOUS
  Filled 2018-01-07: qty 2

## 2018-01-07 MED ORDER — LORAZEPAM 2 MG/ML IJ SOLN: 1.0000 mg | Freq: Once | INTRAMUSCULAR | Status: DC | PRN

## 2018-01-07 MED ORDER — LORAZEPAM 2 MG/ML IJ SOLN: 1.0000 mg | Freq: Once | INTRAMUSCULAR | Status: DC

## 2018-01-07 MED ORDER — AZITHROMYCIN 250 MG PO TABS
1000.0000 mg | ORAL_TABLET | Freq: Once | ORAL | Status: AC
  Administered 2018-01-08: 1000 mg via ORAL
  Filled 2018-01-07: qty 4
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
## 2018-01-07 NOTE — BH Assessment (Signed)
[facility] Assessment Progress Note  Clinician attempted TTS assessment, but pt was too somnolent to participate. She kept falling back asleep. She never opened up her eyes.   Barbara ShockSamantha M. Ladona Ridgelaylor, MS, NCC, LPCA Counselor

## 2018-01-07 NOTE — ED Notes (Signed)
Patient upset stating she wants to leave, attempted to leave room and redirected. Security called to bedside.
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
## 2018-01-07 NOTE — ED Notes (Signed)
Pt. Transferred to Acute Unit from ED to room 36 after screening for contraband. Report to include Situation, Background, Assessment and Recommendations from Sarah RN. Pt. Oriented to unit including Q15 minute rounds as well as the security cameras for their protection. Patient is alert and oriented, warm and dry in no acute distress. Patient denies SI, HI, and AVH. Pt. Encouraged to let me know if needs arise.

## 2018-01-07 NOTE — BH Assessment (Signed)
[facility] Assessment Progress Note  Per Nira ConnJason Berry, NP pt is recommended for inpt treatment. EDP Dr. Effie ShyWentz, MD and pt's nurse Maralyn SagoSarah, RN advised of the recommendation and in agreement with plan of care.   Princess BruinsAquicha Micahel Omlor, MSW, LCSW Therapeutic Triage Specialist  [phone]

## 2018-01-07 NOTE — ED Notes (Signed)
Pt ambulates to BR w/o difficulty. Dr Freida BusmanAllen notified that pt is awake and ambulatory

## 2018-01-07 NOTE — Progress Notes (Signed)
TTS will attempt to assess pt while she is awake. Per chart, pt is currently verbally aggressive and attempting to leave the ED.  Barbara Mack, MSW, LCSW Therapeutic Triage Specialist  [phone]
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
## 2018-01-07 NOTE — ED Notes (Signed)
[employer] officer delivered pt IVC papers from magistrate.

## 2018-01-07 NOTE — ED Notes (Signed)
[id number] Expected date:  Expected time:  Means of arrival:  Comments: Room 16
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
## 2018-01-07 NOTE — ED Notes (Signed)
Patient slamming room door and continues to be verbally aggressive. Room door opened and explained to patient she must keep the door open.

## 2018-01-07 NOTE — ED Notes (Signed)
SANE RN reports that pt is still too sedated to assess. She requests to call them back when pt is fully awake
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
## 2018-01-07 NOTE — ED Notes (Signed)
Sitter at bedside.

## 2018-01-07 NOTE — ED Notes (Signed)
TTS and SANE at bedside

## 2018-01-07 NOTE — ED Notes (Addendum)
Pt restraints attached to bilateral ankles and wrists but released from bed as pt is sleeping soundly with even, unlabored resp. Will continue to monitor for status changes. Pt VSS

## 2018-01-08 DIAGNOSIS — F1910 Other psychoactive substance abuse, uncomplicated: Secondary | ICD-10-CM

## 2018-01-08 DIAGNOSIS — R45851 Suicidal ideations: Secondary | ICD-10-CM | POA: Diagnosis not present

## 2018-01-08 DIAGNOSIS — Z87891 Personal history of nicotine dependence: Secondary | ICD-10-CM

## 2018-01-08 DIAGNOSIS — F1290 Cannabis use, unspecified, uncomplicated: Secondary | ICD-10-CM | POA: Diagnosis not present
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
## 2018-01-08 NOTE — ED Notes (Signed)
Hourly rounding reveals patient sleeping in room. No complaints, stable, in no acute distress. Q15 minute rounds and monitoring via Security Cam[employer] to continue. 
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
## 2018-01-08 NOTE — Progress Notes (Signed)
IVC rescinded at 11:25AM

## 2018-01-08 NOTE — Patient Outreach (Signed)
CPSS met with patient and provided substance use recovery support. Patient stressed how she did not want to go to rehab, but wanted to quit using cocaine. CPSS talked to the patient about substance use recovery support groups and provided two different support group resources. CPSS also provided CPSS contact information and encouraged the patient to contact CPSS at anytime for substance use recovery support.

## 2018-01-08 NOTE — BH Assessment (Addendum)
[facility] Assessment Progress Note This Clinical research associatewriter spoke with patient's sister Barbara Mack [phone] (with patient's permission) who agreed to allow patient to temporally reside at her residence and monitor her behaviors. Mack stated she is aware of her sister's history and does not feel if patient is discharged that patient would be a harm to hersrelf or others. Patient will also be seen by peer support to assist with providing aftercare resources. Patient's sister will transport at 1400 hours.

## 2018-01-08 NOTE — ED Notes (Signed)
Pt discharged safely with friend.  Patient has safety plan in place.  Resources were given.

## 2018-01-08 NOTE — [facility] Suicide Risk Assessment (Signed)
Suicide Risk Assessment  Discharge Assessment   [facility]   Principal Problem: <principal problem not specified> Discharge Diagnoses:  Patient Active Problem List   Diagnosis Date Noted  . Normal labor [O80, Z37.9] 02/16/2017  . GERD (gastroesophageal reflux disease) [K21.9] 01/04/2017  . Supervision of normal first pregnancy, antepartum [Z34.00] 11/07/2016  . Gonorrhea affecting pregnancy, antepartum [O98.219] 11/07/2016  . Insufficient prenatal care [O09.30] 11/07/2016    Total Time spent with patient: 45 minutes  Musculoskeletal: Strength & Muscle Tone: within normal limits Gait & Station: normal Patient leans: N/A  Psychiatric Specialty Exam: Physical Exam  Constitutional: She is oriented to person, place, and time. She appears well-developed and well-nourished.  HENT:  Head: Normocephalic.  Respiratory: Effort normal.  Musculoskeletal: Normal range of motion.  Neurological: She is alert and oriented to person, place, and time.  Psychiatric: She has a normal mood and affect. Her speech is normal and behavior is normal. Thought content normal. Cognition and memory are normal. She expresses impulsivity.   Review of Systems  Psychiatric/Behavioral: Positive for depression and substance abuse. Negative for hallucinations, memory loss and suicidal ideas. The patient is not nervous/anxious and does not have insomnia.   All other systems reviewed and are negative.  Blood pressure (!) 134/93, pulse 73, temperature 98.6 F (37 C), temperature source Oral, resp. rate 16, SpO2 99 %, unknown if currently breastfeeding.There is no height or weight on file to calculate BMI. General Appearance: Casual Eye Contact:  Good Speech:  Clear and Coherent Volume:  Normal Mood:  Anxious Affect:  Congruent Thought Process:  Coherent, Goal Directed and Linear Orientation:  Full (Time, Place, and Person) Thought Content:  Logical Suicidal Thoughts:  No Homicidal  Thoughts:  No Memory:  Immediate;   Good Recent;   Good Remote;   Fair Judgement:  Fair Insight:  Fair Psychomotor Activity:  Normal Concentration:  Concentration: Good and Attention Span: Good Recall:  Good Fund of Knowledge:  Good Language:  Good Akathisia:  No Handed:  Right AIMS (if indicated):    Assets:  Communication Skills Desire for Improvement Financial Resources/Insurance Housing Resilience Social Support ADL's:  Intact Cognition:  WNL   Mental Status Per Nursing Assessment::   On Admission:   Suicidal ideation while "high on drugs and intoxicated."  Demographic Factors:  Adolescent or young adult and Low socioeconomic status  Loss Factors: Loss of significant relationship  Historical Factors: Impulsivity  Risk Reduction Factors:   Sense of responsibility to family, Employed and Living with another person, especially a relative  Continued Clinical Symptoms:  Depression:   Impulsivity Alcohol/Substance Abuse/Dependencies  Cognitive Features That Contribute To Risk:  Closed-mindedness    Suicide Risk:  Minimal: No identifiable suicidal ideation.  Patients presenting with no risk factors but with morbid ruminations; may be classified as minimal risk based on the severity of the depressive symptoms    Plan Of Care/Follow-up recommendations:  Activity:  as tolerated Diet:  Heart Healthy  Laveda AbbeLaurie Britton Oluwatosin Higginson, NP 01/08/2018, 11:33 AM

## 2019-01-29 IMAGING — US US MFM FETAL BPP W/O NON-STRESS
1 series · 12 of 23 positions shown · non-contrast
Comparison: none

[Series 1: us mfm fetal bpp w/o non-stress · 23 acquisitions, 12 frames shown]
[im 1/23]
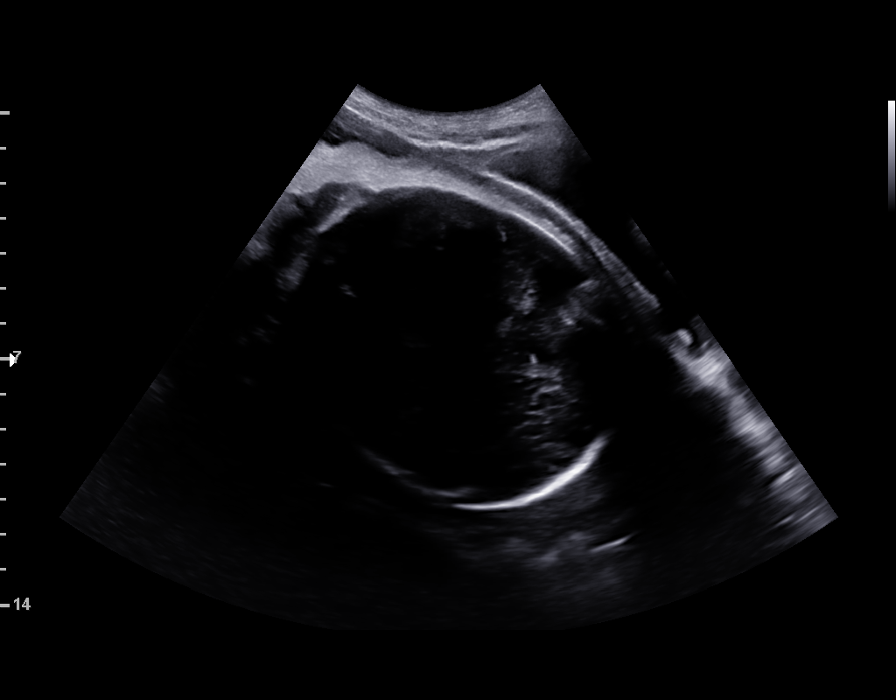
[im 3/23]
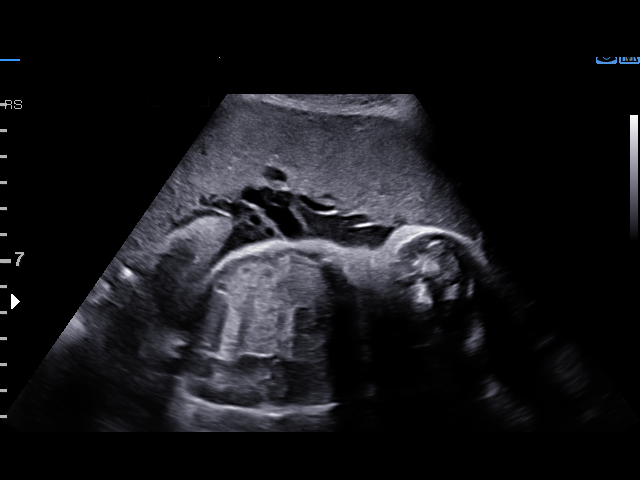
[im 5/23]
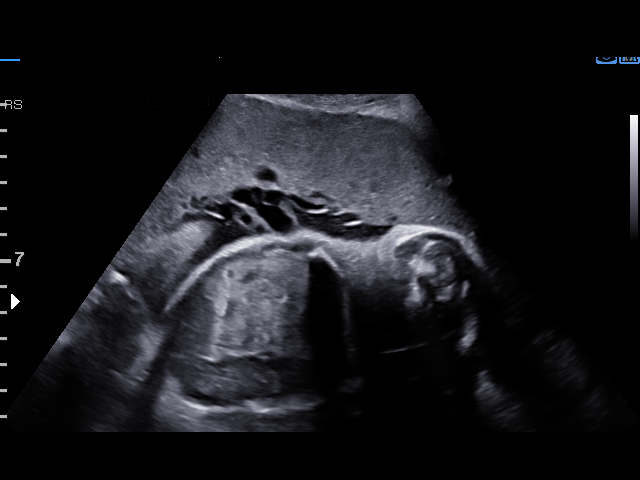
[im 7/23]
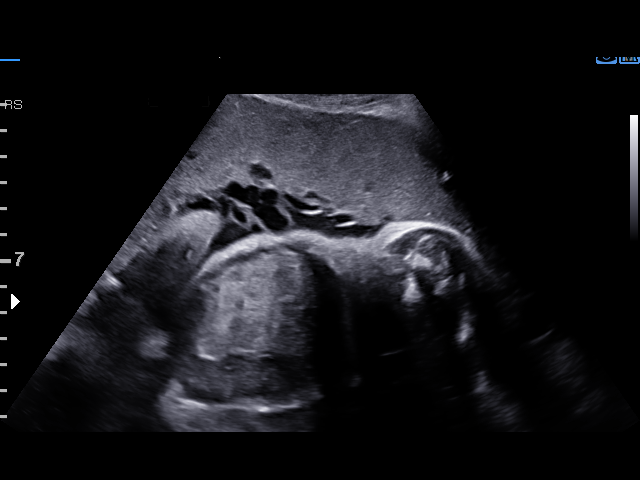
[im 9/23]
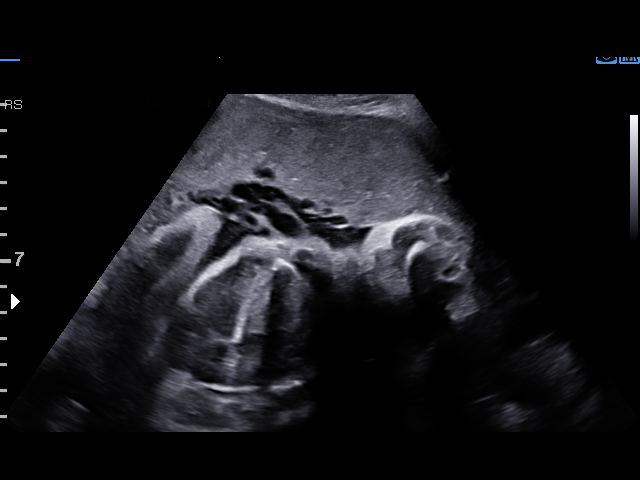
[im 11/23]
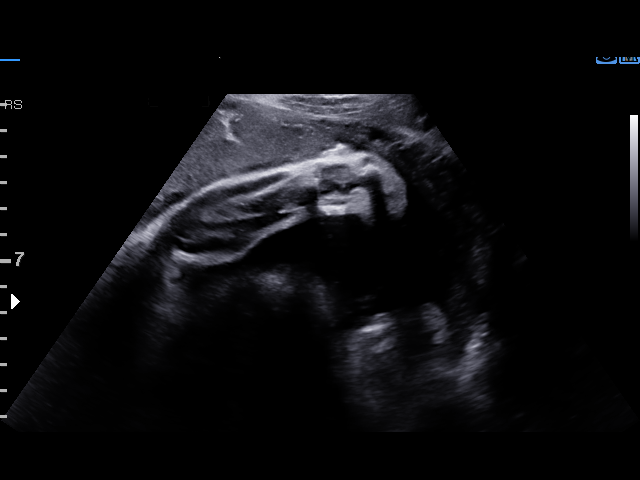
[im 13/23]
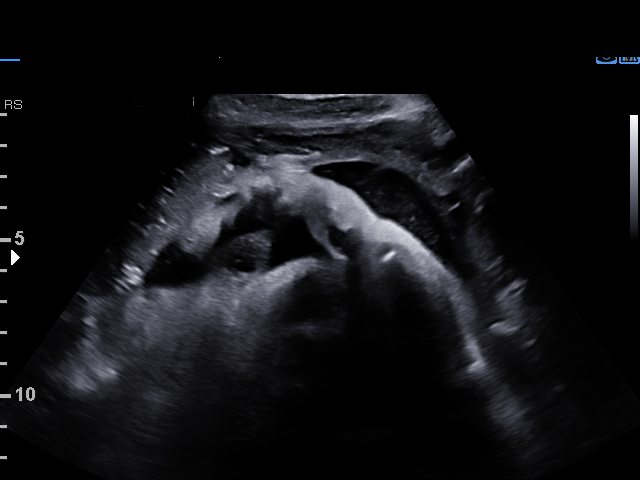
[im 15/23]
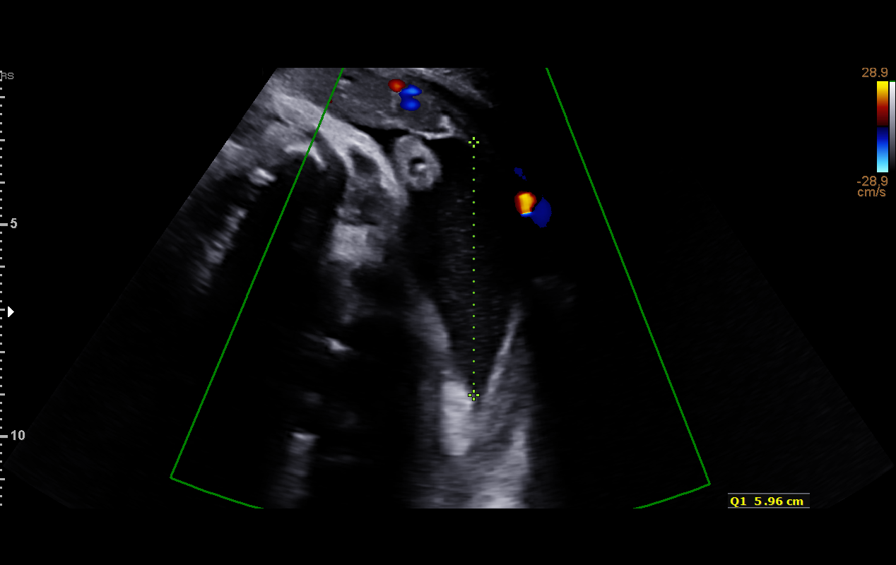
[im 17/23]
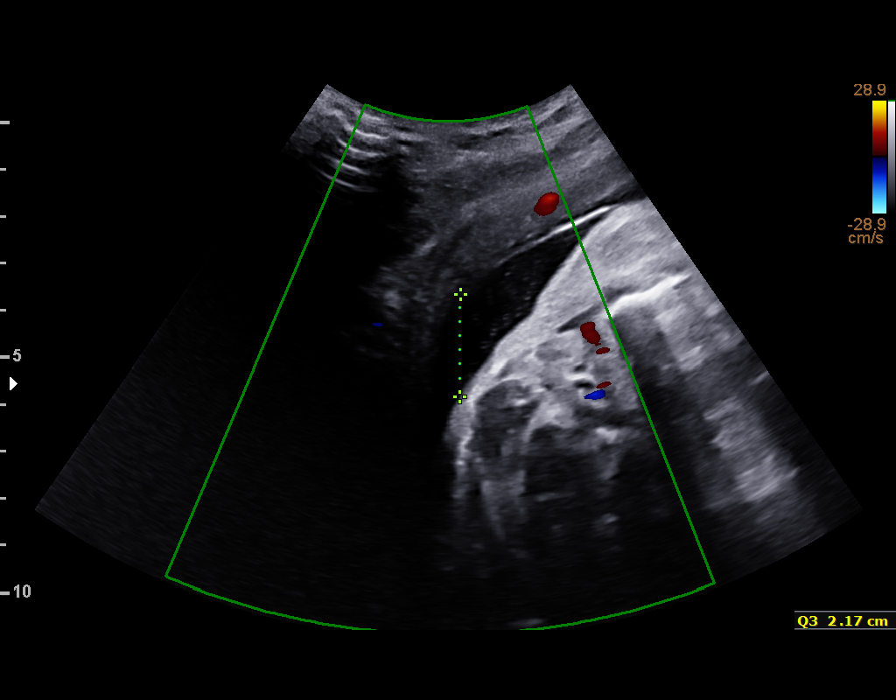
[im 19/23]
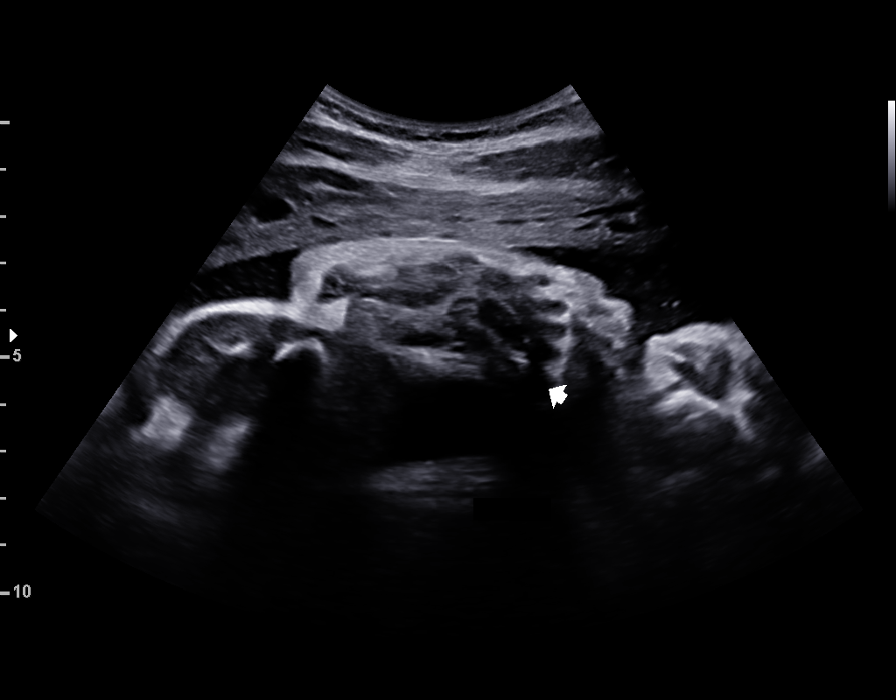
[im 21/23]
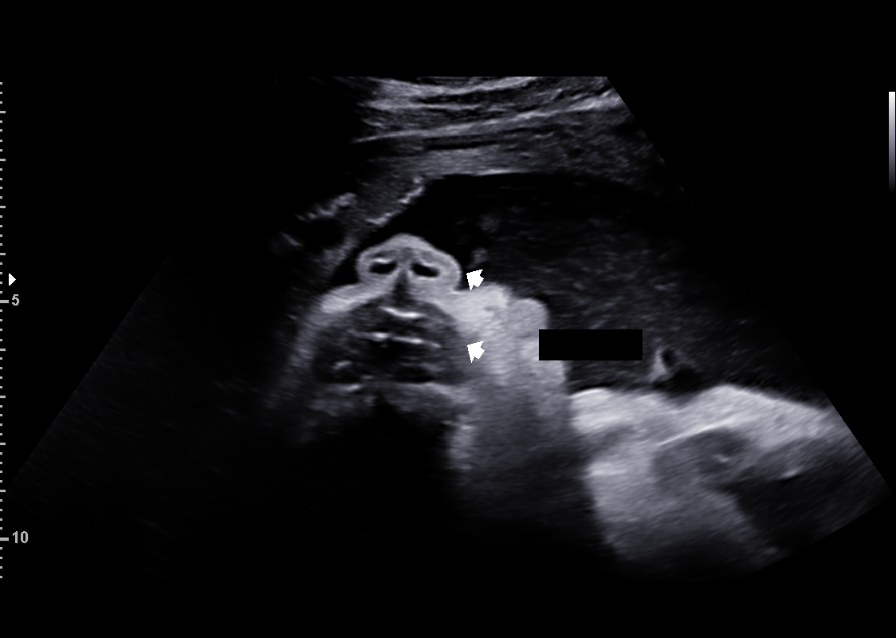
[im 23/23]
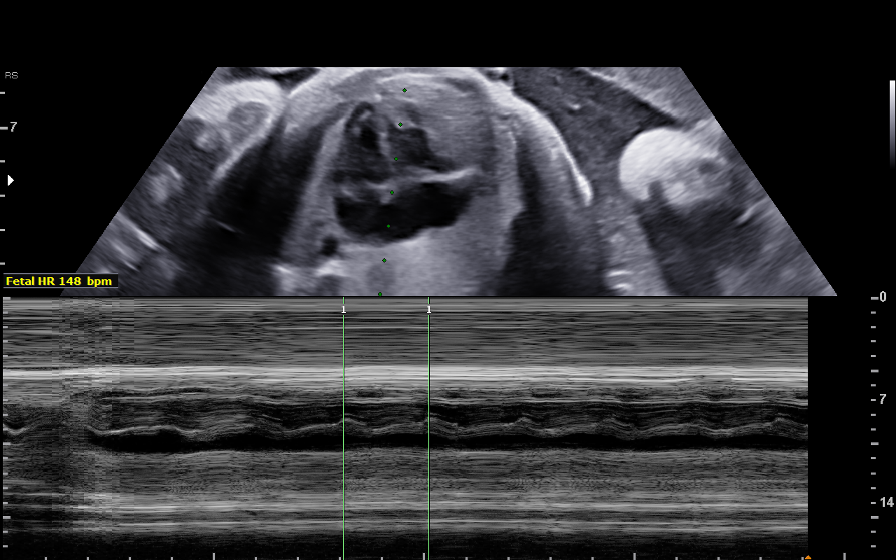

[12 of 23 positions shown; findings below may reference images not displayed]

Name:       MILTON DAVID LANTIGUA                Visit Date:  02/15/2017 [REDACTED]

1  TMAR ISHTAYA           [id number]      [id number]     [id number]
Indications

37 weeks gestation of pregnancy
Teen pregnancy
Non-reactive NST
OB History

Gravidity:    2         Term:   0        Prem:   0        SAB:   1
TOP:          0       Ectopic:  0        Living: 0
Fetal Evaluation

Num Of Fetuses:     1
Fetal Heart         148
Rate(bpm):
Cardiac Activity:   Observed
Presentation:       Cephalic

Amniotic Fluid
AFI FV:      Subjectively within normal limits

AFI Sum(cm)     %Tile       Largest Pocket(cm)
15.72           59

RUQ(cm)       RLQ(cm)       LUQ(cm)        LLQ(cm)
5.96
Biophysical Evaluation

Amniotic F.V:   Pocket => 2 cm two         F. Tone:        Observed
planes
F. Movement:    Observed                   Score:          [DATE]
F. Breathing:   Observed
Gestational Age

LMP:           37w 0d       Date:   [DATE]                 EDD:   06/01/16
Best:          37w 0d    Det. By:   LMP  (03/08/17)          EDD:   06/01/16
Impression

SIUP at 37+0 weeks
Cephalic presentation
Normal amniotic fluid volume
BPP 03/08/17
Recommendations

Follow-up as clinically indicated
